# Patient Record
Sex: Female | Born: 1970 | Race: White | Hispanic: Yes | Marital: Married | Smoking: Former smoker
Health system: Southern US, Community
[De-identification: ages and names within clinical notes are randomized; demographics above are authoritative.]

## PROBLEM LIST (undated history)

## (undated) DIAGNOSIS — E669 Obesity, unspecified: Secondary | ICD-10-CM

## (undated) DIAGNOSIS — K219 Gastro-esophageal reflux disease without esophagitis: Secondary | ICD-10-CM

## (undated) DIAGNOSIS — J45909 Unspecified asthma, uncomplicated: Secondary | ICD-10-CM

## (undated) HISTORY — PX: TUBAL LIGATION: SHX77

---

## 2000-05-03 ENCOUNTER — Emergency Department (HOSPITAL_COMMUNITY): Admission: EM | Admit: 2000-05-03 | Discharge: 2000-05-03 | Payer: Self-pay | Admitting: Emergency Medicine

## 2000-05-04 ENCOUNTER — Ambulatory Visit (HOSPITAL_COMMUNITY): Admission: RE | Admit: 2000-05-04 | Discharge: 2000-05-04 | Payer: Self-pay | Admitting: Emergency Medicine

## 2000-05-04 ENCOUNTER — Encounter: Payer: Self-pay | Admitting: Emergency Medicine

## 2000-05-07 ENCOUNTER — Other Ambulatory Visit: Admission: RE | Admit: 2000-05-07 | Discharge: 2000-05-07 | Payer: Self-pay | Admitting: Gynecology

## 2000-06-30 ENCOUNTER — Emergency Department (HOSPITAL_COMMUNITY): Admission: EM | Admit: 2000-06-30 | Discharge: 2000-07-01 | Payer: Self-pay | Admitting: Emergency Medicine

## 2000-07-01 ENCOUNTER — Encounter: Payer: Self-pay | Admitting: Emergency Medicine

## 2003-01-23 ENCOUNTER — Emergency Department (HOSPITAL_COMMUNITY): Admission: AD | Admit: 2003-01-23 | Discharge: 2003-01-23 | Payer: Self-pay | Admitting: Family Medicine

## 2003-05-30 ENCOUNTER — Emergency Department (HOSPITAL_COMMUNITY): Admission: EM | Admit: 2003-05-30 | Discharge: 2003-05-30 | Payer: Self-pay | Admitting: Family Medicine

## 2003-12-28 ENCOUNTER — Ambulatory Visit: Payer: Self-pay | Admitting: Family Medicine

## 2004-01-25 ENCOUNTER — Ambulatory Visit: Payer: Self-pay | Admitting: Family Medicine

## 2004-02-01 ENCOUNTER — Other Ambulatory Visit: Admission: RE | Admit: 2004-02-01 | Discharge: 2004-02-01 | Payer: Self-pay | Admitting: Family Medicine

## 2004-02-01 ENCOUNTER — Ambulatory Visit: Payer: Self-pay | Admitting: Family Medicine

## 2004-03-26 ENCOUNTER — Ambulatory Visit: Payer: Self-pay | Admitting: Family Medicine

## 2004-03-27 ENCOUNTER — Other Ambulatory Visit: Admission: RE | Admit: 2004-03-27 | Discharge: 2004-03-27 | Payer: Self-pay | Admitting: Obstetrics and Gynecology

## 2004-03-27 ENCOUNTER — Ambulatory Visit: Payer: Self-pay | Admitting: Family Medicine

## 2004-04-22 ENCOUNTER — Ambulatory Visit: Payer: Self-pay | Admitting: Obstetrics and Gynecology

## 2004-05-01 ENCOUNTER — Ambulatory Visit: Payer: Self-pay | Admitting: Internal Medicine

## 2004-07-17 ENCOUNTER — Ambulatory Visit: Payer: Self-pay | Admitting: Family Medicine

## 2004-07-18 ENCOUNTER — Ambulatory Visit: Payer: Self-pay | Admitting: Family Medicine

## 2004-07-22 ENCOUNTER — Ambulatory Visit: Payer: Self-pay | Admitting: *Deleted

## 2004-09-18 ENCOUNTER — Encounter (INDEPENDENT_AMBULATORY_CARE_PROVIDER_SITE_OTHER): Payer: Self-pay | Admitting: *Deleted

## 2004-09-18 ENCOUNTER — Ambulatory Visit: Payer: Self-pay | Admitting: *Deleted

## 2004-11-11 ENCOUNTER — Ambulatory Visit: Payer: Self-pay | Admitting: Obstetrics and Gynecology

## 2004-11-17 ENCOUNTER — Ambulatory Visit: Payer: Self-pay | Admitting: Obstetrics and Gynecology

## 2004-11-17 ENCOUNTER — Encounter (INDEPENDENT_AMBULATORY_CARE_PROVIDER_SITE_OTHER): Payer: Self-pay | Admitting: *Deleted

## 2004-11-17 ENCOUNTER — Ambulatory Visit (HOSPITAL_COMMUNITY): Admission: RE | Admit: 2004-11-17 | Discharge: 2004-11-17 | Payer: Self-pay | Admitting: Obstetrics and Gynecology

## 2004-12-04 ENCOUNTER — Ambulatory Visit: Payer: Self-pay | Admitting: Obstetrics and Gynecology

## 2005-01-12 ENCOUNTER — Ambulatory Visit (HOSPITAL_COMMUNITY): Admission: RE | Admit: 2005-01-12 | Discharge: 2005-01-12 | Payer: Self-pay | Admitting: Family Medicine

## 2005-01-12 ENCOUNTER — Emergency Department (HOSPITAL_COMMUNITY): Admission: EM | Admit: 2005-01-12 | Discharge: 2005-01-12 | Payer: Self-pay | Admitting: Family Medicine

## 2005-06-04 ENCOUNTER — Encounter (INDEPENDENT_AMBULATORY_CARE_PROVIDER_SITE_OTHER): Payer: Self-pay | Admitting: *Deleted

## 2005-06-04 ENCOUNTER — Ambulatory Visit: Payer: Self-pay | Admitting: Obstetrics and Gynecology

## 2007-07-07 ENCOUNTER — Ambulatory Visit: Payer: Self-pay | Admitting: Obstetrics & Gynecology

## 2007-07-07 ENCOUNTER — Encounter: Payer: Self-pay | Admitting: Obstetrics & Gynecology

## 2007-07-21 ENCOUNTER — Ambulatory Visit: Payer: Self-pay | Admitting: Obstetrics and Gynecology

## 2007-07-25 ENCOUNTER — Ambulatory Visit (HOSPITAL_COMMUNITY): Admission: RE | Admit: 2007-07-25 | Discharge: 2007-07-25 | Payer: Self-pay | Admitting: Family Medicine

## 2008-03-13 ENCOUNTER — Ambulatory Visit: Payer: Self-pay | Admitting: Family Medicine

## 2008-07-04 ENCOUNTER — Ambulatory Visit: Payer: Self-pay | Admitting: Family Medicine

## 2008-10-10 ENCOUNTER — Encounter (INDEPENDENT_AMBULATORY_CARE_PROVIDER_SITE_OTHER): Payer: Self-pay | Admitting: Adult Health

## 2008-10-10 ENCOUNTER — Ambulatory Visit: Payer: Self-pay | Admitting: Internal Medicine

## 2008-10-10 LAB — CONVERTED CEMR LAB
AST: 16 units/L (ref 0–37)
Albumin: 4.2 g/dL (ref 3.5–5.2)
Alkaline Phosphatase: 74 units/L (ref 39–117)
Anti Nuclear Antibody(ANA): NEGATIVE
BUN: 12 mg/dL (ref 6–23)
Basophils Absolute: 0 10*3/uL (ref 0.0–0.1)
Basophils Relative: 0 % (ref 0–1)
Creatinine, Ser: 0.61 mg/dL (ref 0.40–1.20)
Eosinophils Absolute: 0.4 10*3/uL (ref 0.0–0.7)
Eosinophils Relative: 4 % (ref 0–5)
Glucose, Bld: 77 mg/dL (ref 70–99)
HCT: 40.4 % (ref 36.0–46.0)
HDL: 47 mg/dL (ref >39)
LDL Cholesterol: 79 mg/dL (ref 0–99)
MCV: 89.4 fL (ref 78.0–100.0)
Neutrophils Relative %: 46 % (ref 43–77)
Platelets: 366 10*3/uL (ref 150–400)
RDW: 13.7 % (ref 11.5–15.5)
Total Bilirubin: 0.4 mg/dL (ref 0.3–1.2)
Total CHOL/HDL Ratio: 3
Triglycerides: 78 mg/dL (ref <150)
VLDL: 16 mg/dL (ref 0–40)
Vit D, 25-Hydroxy: 9 ng/mL — ABNORMAL LOW (ref 30–89)
WBC: 9.3 10*3/uL (ref 4.0–10.5)

## 2008-10-11 ENCOUNTER — Ambulatory Visit (HOSPITAL_COMMUNITY): Admission: RE | Admit: 2008-10-11 | Discharge: 2008-10-11 | Payer: Self-pay | Admitting: Internal Medicine

## 2008-10-25 ENCOUNTER — Ambulatory Visit: Payer: Self-pay | Admitting: Internal Medicine

## 2008-11-27 ENCOUNTER — Encounter (INDEPENDENT_AMBULATORY_CARE_PROVIDER_SITE_OTHER): Payer: Self-pay | Admitting: Adult Health

## 2008-11-27 ENCOUNTER — Ambulatory Visit: Payer: Self-pay | Admitting: Internal Medicine

## 2008-12-27 ENCOUNTER — Ambulatory Visit: Payer: Self-pay | Admitting: Internal Medicine

## 2009-01-28 ENCOUNTER — Ambulatory Visit: Payer: Self-pay | Admitting: Internal Medicine

## 2009-09-26 ENCOUNTER — Ambulatory Visit: Payer: Self-pay | Admitting: Family Medicine

## 2009-09-26 ENCOUNTER — Ambulatory Visit (HOSPITAL_COMMUNITY): Admission: RE | Admit: 2009-09-26 | Discharge: 2009-09-26 | Payer: Self-pay | Admitting: Family Medicine

## 2009-11-26 ENCOUNTER — Ambulatory Visit: Payer: Self-pay | Admitting: Internal Medicine

## 2009-11-28 ENCOUNTER — Encounter
Admission: RE | Admit: 2009-11-28 | Discharge: 2010-01-14 | Payer: Self-pay | Source: Home / Self Care | Attending: Family Medicine | Admitting: Family Medicine

## 2009-12-24 ENCOUNTER — Encounter (INDEPENDENT_AMBULATORY_CARE_PROVIDER_SITE_OTHER): Payer: Self-pay | Admitting: Family Medicine

## 2009-12-24 LAB — CONVERTED CEMR LAB
Albumin: 3.7 g/dL (ref 3.5–5.2)
Alkaline Phosphatase: 68 units/L (ref 39–117)
Calcium: 8.7 mg/dL (ref 8.4–10.5)
Chloride: 104 meq/L (ref 96–112)
Glucose, Bld: 95 mg/dL (ref 70–99)
Hgb A1c MFr Bld: 5.7 % — ABNORMAL HIGH (ref <5.7)
LDL Cholesterol: 80 mg/dL (ref 0–99)
Potassium: 4 meq/L (ref 3.5–5.3)
Sodium: 138 meq/L (ref 135–145)
TSH: 1.061 microintl units/mL (ref 0.350–4.500)
Total Protein: 6.7 g/dL (ref 6.0–8.3)
Triglycerides: 93 mg/dL (ref <150)

## 2010-01-17 ENCOUNTER — Emergency Department (HOSPITAL_COMMUNITY)
Admission: EM | Admit: 2010-01-17 | Discharge: 2010-01-17 | Payer: Self-pay | Source: Home / Self Care | Admitting: Family Medicine

## 2010-04-07 LAB — POCT INFECTIOUS MONO SCREEN: Mono Screen: NEGATIVE

## 2010-04-07 LAB — POCT RAPID STREP A (OFFICE): Streptococcus, Group A Screen (Direct): NEGATIVE

## 2010-06-10 NOTE — Group Therapy Note (Signed)
NAME:  Wendy Bradley, WILE.:  [id number]   MEDICAL RECORD NO.:  [id number]          PATIENT TYPE:  WOC   LOCATION:  [facility]                   FACILITY:  [facility]   PHYSICIAN:  Argentina Donovan, MD        DATE OF BIRTH:  [birth year]   DATE OF SERVICE:  07/21/2007                                  CLINIC NOTE   The patient is a 40 year old Hispanic married female, gravida 4, para 4-  0-0-4, who was in a short time ago because of amenorrhea.  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  She came back  today for her results, with the story that she had been 18 months  without a period until last week.  The patient weighs 292 pounds and I  have told her that there is possibility she could have an endometrial  hyperplasia; and we discussed that. Sending her for an ultrasound; if  her endometrial stripe is greater than normal, I think we have to have  her come back with endometrial biopsy.  We will call her and let her  know that.   IMPRESSION:  Secondary amenorrhea, probable polycystic ovarian syndrome.           ______________________________  Argentina Donovan, MD     PR/MEDQ  D:  07/21/2007  T:  07/21/2007  Job:  [id number]

## 2010-06-10 NOTE — Group Therapy Note (Signed)
NAME:  ARDINE, IACOVELLI NO.:  [id number]   MEDICAL RECORD NO.:  [id number]          PATIENT TYPE:  WOC   LOCATION:  [facility]                   FACILITY:  [facility]   PHYSICIAN:  Allie Bossier, MD        DATE OF BIRTH:  [birth year]   DATE OF SERVICE:  07/07/2007                                  CLINIC NOTE   Wendy Bradley is a 40 year old married Hispanic gravida 4, para 4 who comes in  for her annual exam.  She mentioned that her period began Jun 26, 2007.  Prior to that, she had not had a period since December of 2007.   PAST MEDICAL HISTORY:  Morbid obesity. History of CIN-3 status post [facility]  in 2006.  She has been a previous smoker.  She did quit in February of  this year.   PAST SURGICAL HISTORY:  [facility] October 2006, CIN-3, negative margins, and  tubal ligation.   SOCIAL HISTORY:  She quit smoking and drinking this year.  No known drug  allergies.  No latex allergies.   FAMILY HISTORY:  Is significant for cervical cancer in her paternal  grandmother who is deceased.  She denies a family history of breast or  colon malignancies.  She does report a family history of diabetes and  myocardial infarction.   MEDICATIONS:  None.   PHYSICAL EXAMINATION:  Weight 292 pounds, blood pressure 134/90, pulse  104.  She is afebrile.  HEENT:  Normal.  She does have moderate amount of hirsutism facially.  HEART:  Regular rate and rhythm.  BREAST EXAM:  Normal bilaterally.  ABDOMEN:  Obese, benign.  EXTERNAL GENITALIA:  She has acanthosis nigricans of her inner thigh  skin.  External genitalia is without lesions.  Cervix is parous.  Uterus  and adnexa are not appreciably enlarged.  Please note that the exam is  impaired by her central obesity.   ASSESSMENT/PLAN:  1. Annual exam.  I have recommend continued weight loss.  Recommend      self-breast exam, self-vulvar exam monthly.  I checked a Pap smear.      With regard to her vaginal bleeding that had not occurred for over      a  year, I checked a TSH, FSH.  2. Acanthosis nigricans.  Checked a CBG today.      Allie Bossier, MD    MCD/MEDQ  D:  07/07/2007  T:  07/07/2007  Job:  [id number]

## 2010-06-13 NOTE — Group Therapy Note (Signed)
NAME:  Wendy, Bradley NO.:  [id number]   MEDICAL RECORD NO.:  [id number]          PATIENT TYPE:  WOC   LOCATION:  [facility]                   FACILITY:  [facility]   PHYSICIAN:  Carolanne Grumbling, M.D.   DATE OF BIRTH:  Sep 07, 1970   DATE OF SERVICE:  09/18/2004                                    CLINIC NOTE   HISTORY OF PRESENT ILLNESS:  The patient is a 40 year old female here for  follow-up Pap smear. 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 The patient denies any problems since that time. She has  irregular periods. Her last one was August 12 and then she did have some  breakthrough bleeding a couple of days ago. She is not currently bleeding.   PHYSICAL EXAMINATION:  VITAL SIGNS:  Stable, weight 205, height 5 feet 3  inches.  GENERAL:  Well-developed, well-nourished female in no apparent distress.  GENITOURINARY:  Normal external genitalia. Cervix is parous. Pap smear done.   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   PLAN:  The patient will be notified of the Pap smear results and will be  told when to follow up after that.           ______________________________  Carolanne Grumbling, M.D.     TW/MEDQ  D:  09/18/2004  T:  09/19/2004  Job:  [id number]

## 2010-06-13 NOTE — Op Note (Signed)
NAME:  Wendy Bradley, HLAVATY              ACCOUNT NO.:  [id number]   MEDICAL RECORD NO.:  [id number]          PATIENT TYPE:  AMB   LOCATION:  [facility]                           [facility]   PHYSICIAN:  Phil D. Okey Dupre, M.D.     DATE OF BIRTH:  04-21-1970   DATE OF PROCEDURE:  11/17/2004  DATE OF DISCHARGE:                                 OPERATIVE REPORT   PROCEDURES:  Cold knife cauterization of the cervix.   PREOPERATIVE DIAGNOSIS:  Severe cervical dysplasia following cryotherapy  failure.   POSTOPERATIVE DIAGNOSIS:  Severe cervical dysplasia following cryotherapy  failure.   SURGEON:  Javier Glazier. Okey Dupre, M.D.   FIRST ASSISTANT:  Tracy L. Mayford Knife, M.D.   ANESTHESIA:  Started on MAC and switched to general.   ESTIMATED BLOOD LOSS:  30 cc   SPECIMENS TO PATHOLOGY:  Cervical conization.   POSTOPERATIVE CONDITION:  Satisfactory.   OPERATIVE FINDINGS:  On examining the vagina, the cervix was easily  visualized and was found to be significantly fish mouthed with markedly  redundant tissue both with a bladder flap as well as the laterally around  the entire circumference of the cervix.  A weighted speculum was placed in  the posterior fourchette of the vagina.  The anterior lip of the cervix was  grasped with a single-tooth tenaculum, and Deavers were used for exposure.  The lateral sutures of 1-0 chromic were placed in each of the lateral  cervical angles and tied taut.  These  were done in figure-of-eights; 10 cc  of 1% Xylocaine was injected in each of the paracervical areas for  postoperative comfort and a circular incision was made around the entire  circumference of the cervix with an 11 blade approximately 0.5 cm from the  external cervical os which was extended laterally beyond the area of the  cervical separation fishmouth.  Once this was done, those edges were grasped  with a ring forceps and the conization was completed for a total length up  into the cervix of approximately 1.5 cm.   A circumferential pursestring was  run external to the cervix and as high up as possible just below the bladder  reflection, staying very superficial around the entire circumference of  cervix.  This was tied down taut using one chromic catgut.  There was  minimal bleeding during the procedure.  Postoperatively, the entire area was  stained with Lugol's solution and there were no nonstaining areas seen  within the area of the cervix. The patient was transferred recovery room in  the minimal blood loss of 30 mL.  She  tolerated the procedure well. Tape,  instrument, sponge and needle counts were reported correct at the end of the  procedure.          ______________________________  Javier Glazier. Okey Dupre, M.D.    PDR/MEDQ  D:  11/17/2004  T:  11/17/2004  Job:  [id number]

## 2011-08-20 IMAGING — CR DG KNEE COMPLETE 4+V*L*
4 series · 4 of 4 positions shown · non-contrast
Comparison: None

CLINICAL DATA: Left knee pain.

LEFT KNEE - COMPLETE 4+ VIEW

[t knee ap left]
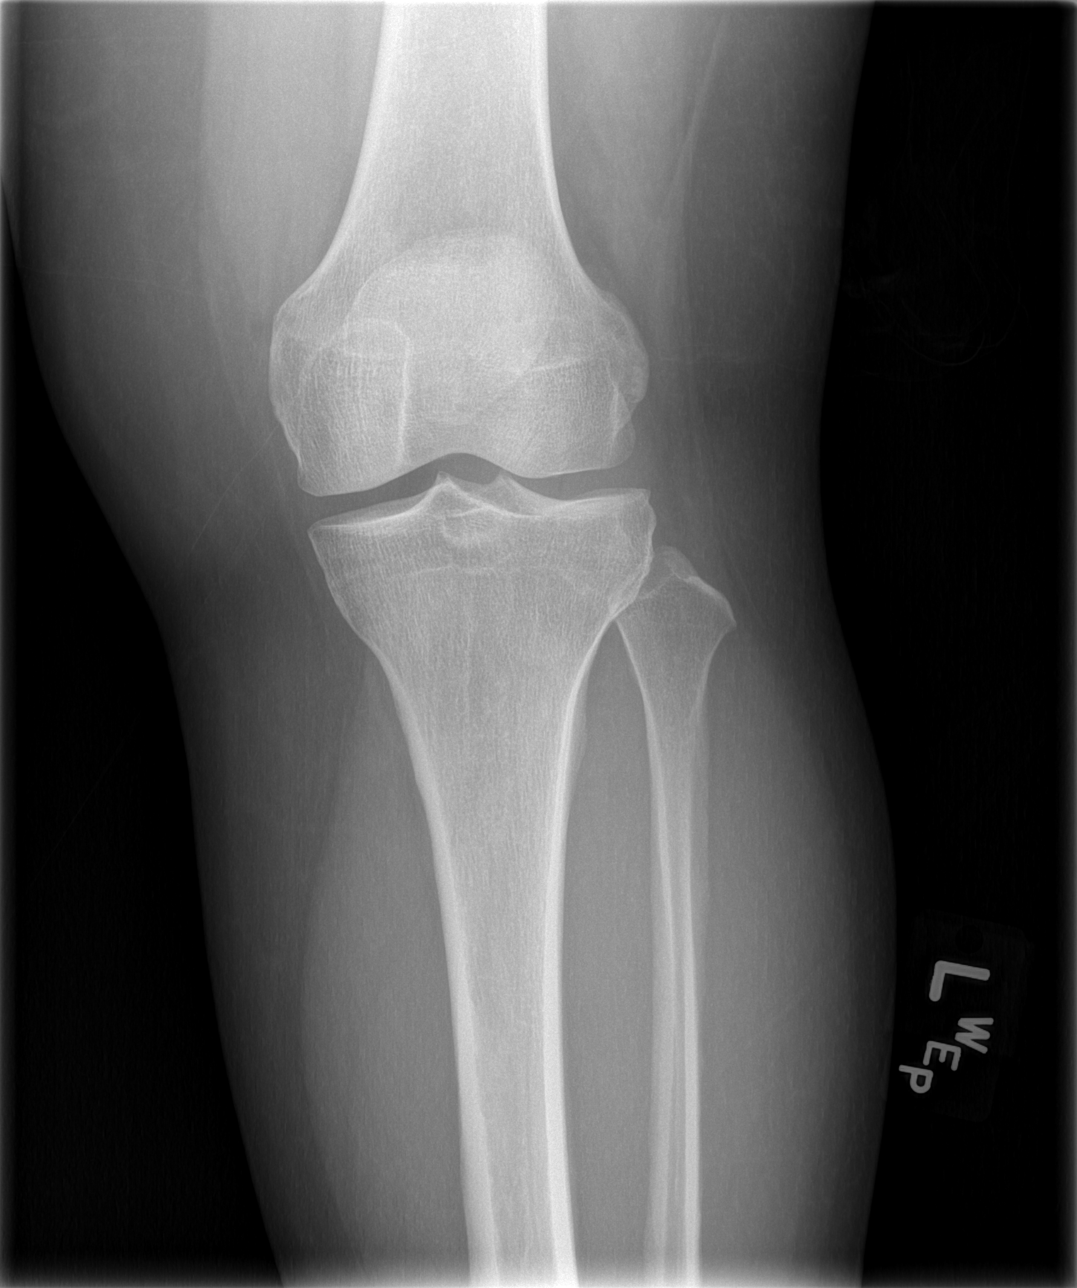

[t knee oblique left (1 of 2)]
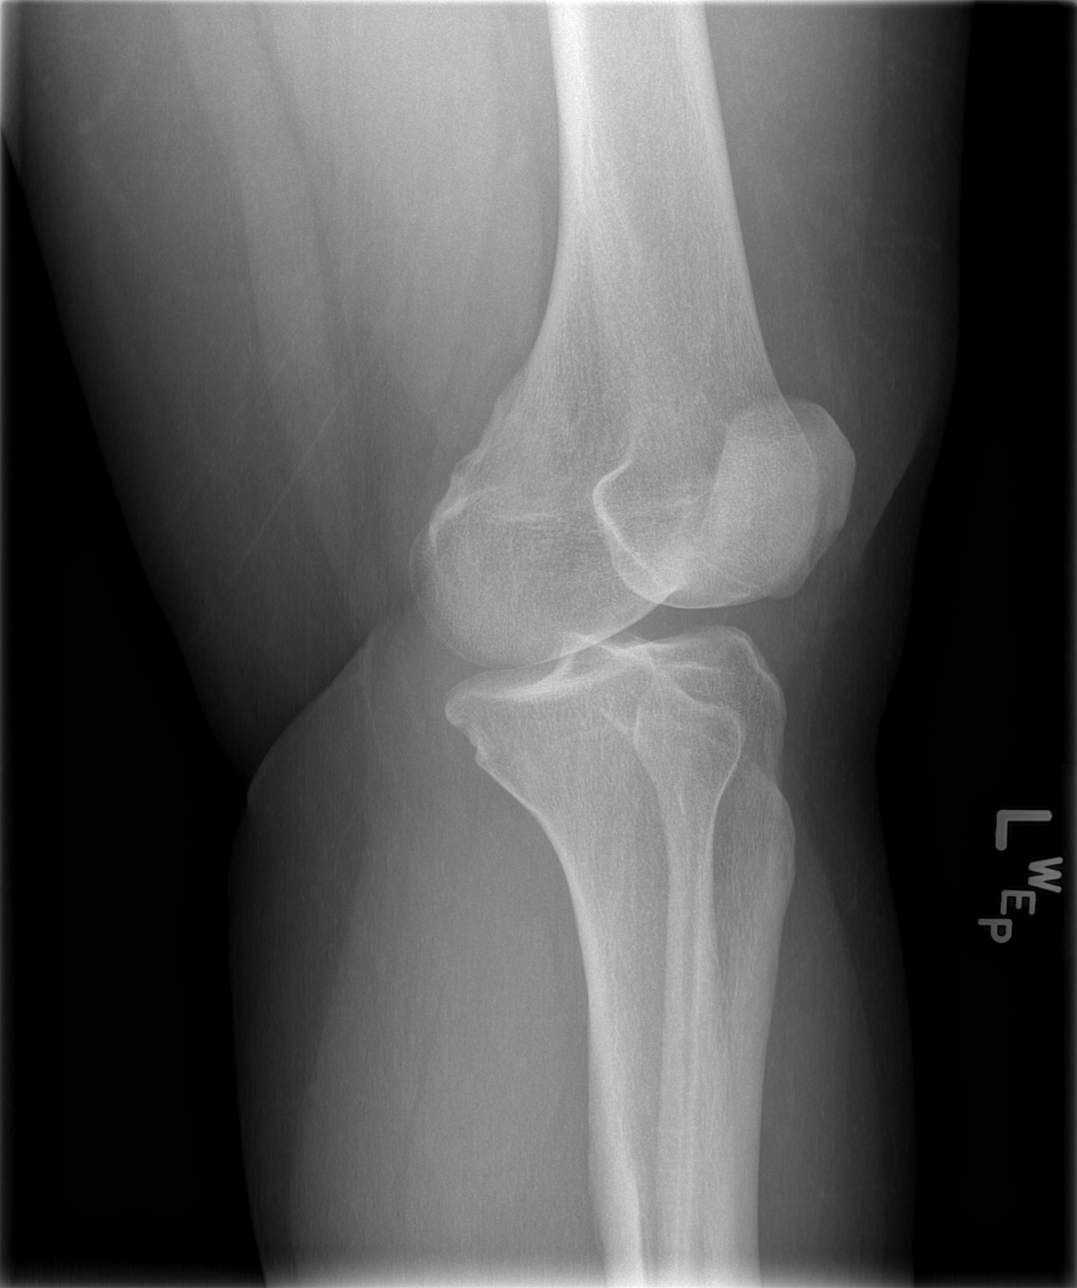

[t knee oblique left (2 of 2)]
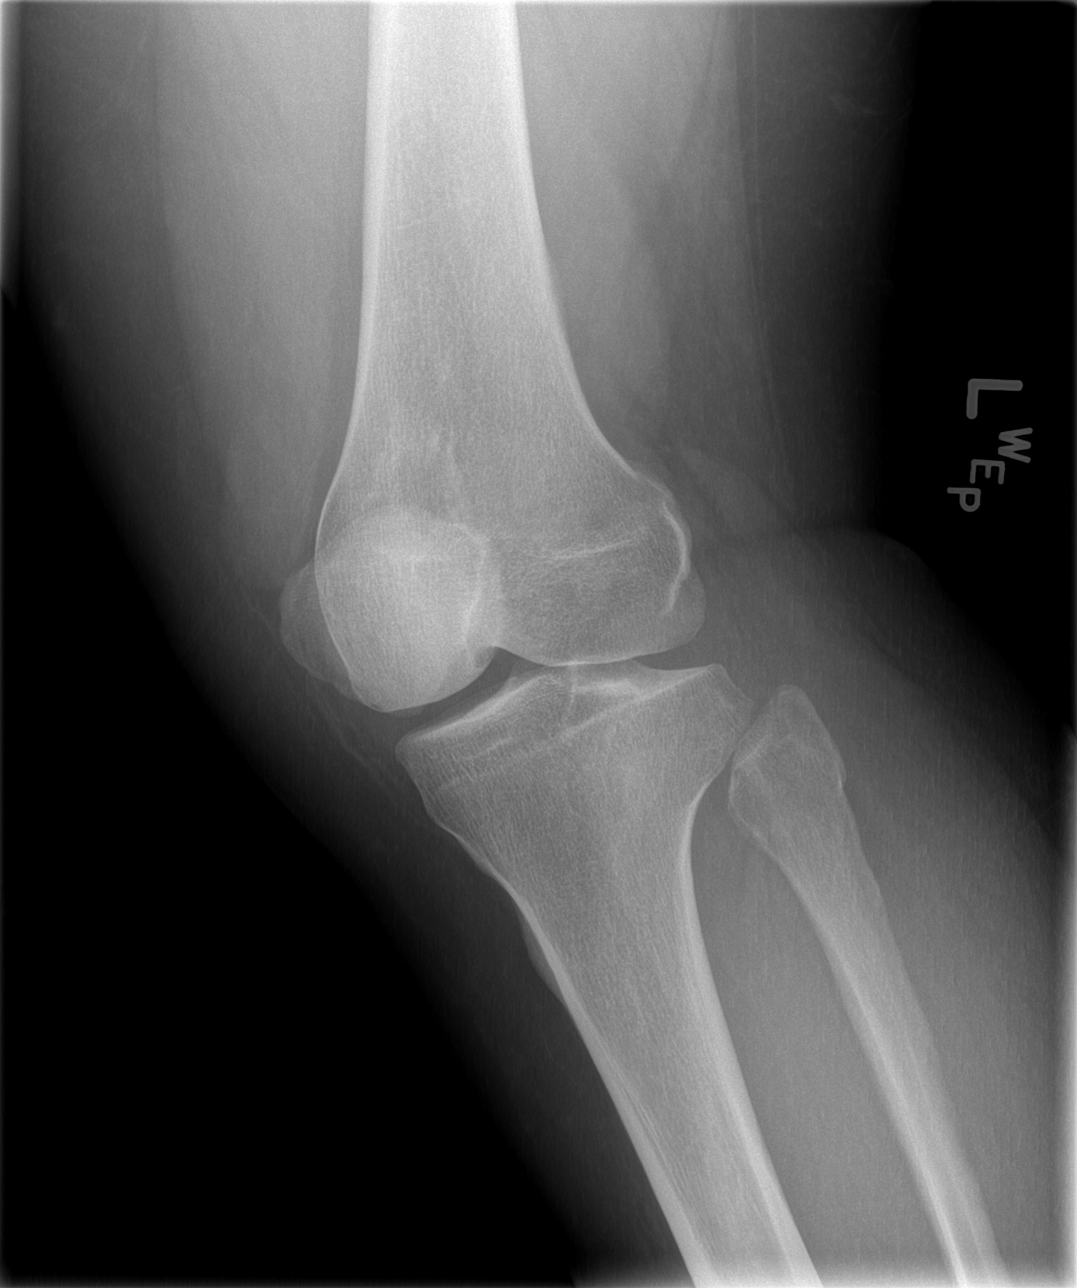

[t knee lat left]
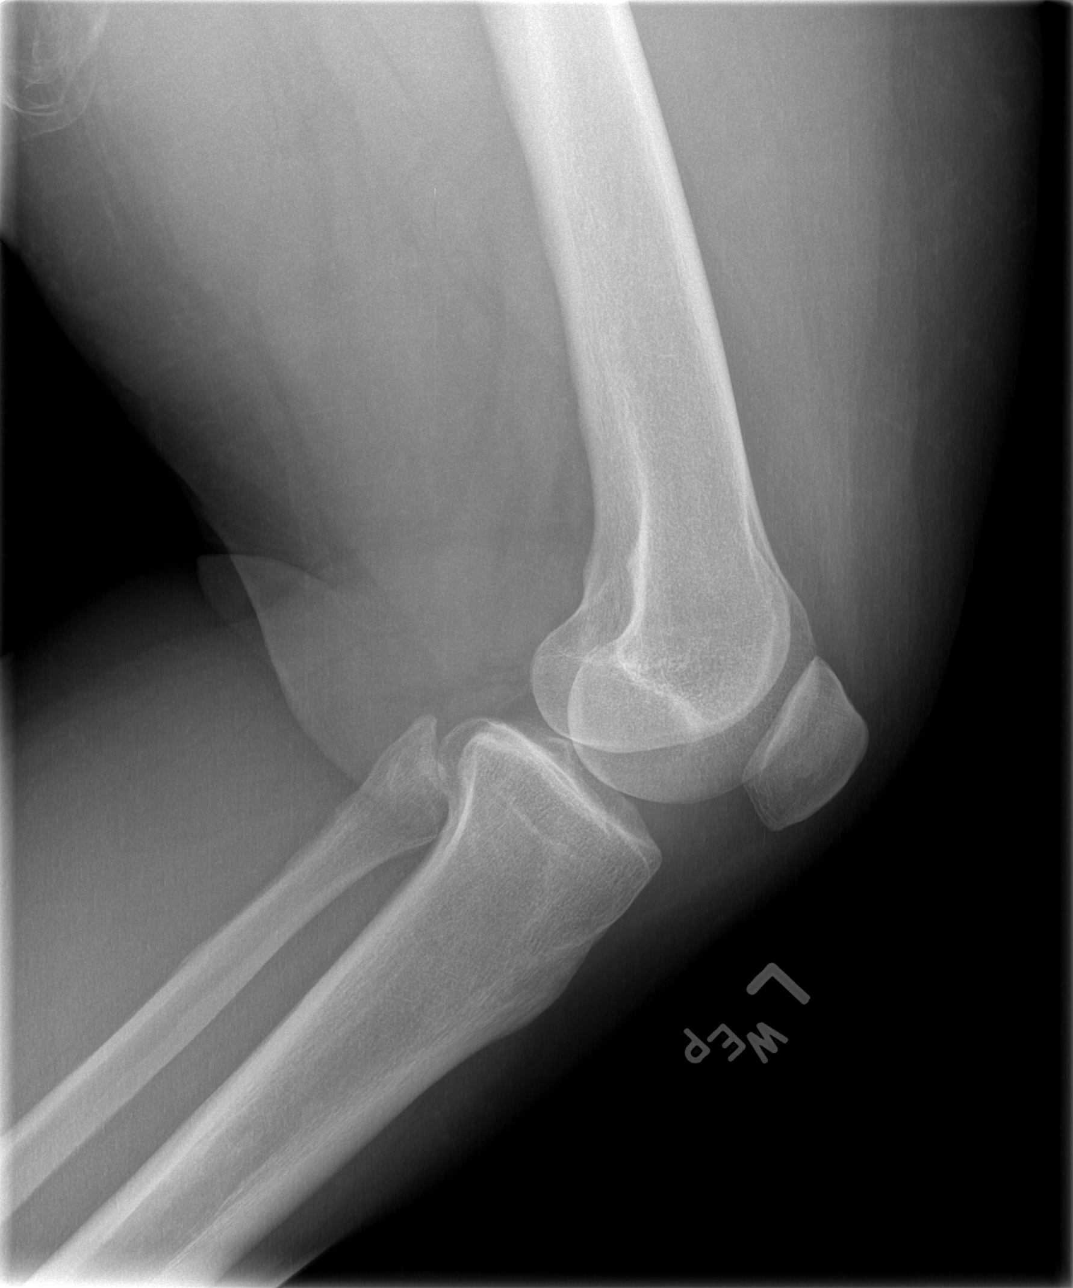

[4 of 4 positions shown; findings below may reference images not displayed]

FINDINGS: The joint spaces are maintained.  No fractures are seen.
No definite joint effusion.  No osteochondral abnormality.
IMPRESSION: No acute bony findings.

## 2013-06-20 ENCOUNTER — Emergency Department (HOSPITAL_COMMUNITY): Payer: Self-pay

## 2013-06-20 ENCOUNTER — Encounter (HOSPITAL_COMMUNITY): Payer: Self-pay | Admitting: Emergency Medicine

## 2013-06-20 ENCOUNTER — Emergency Department (HOSPITAL_COMMUNITY)
Admission: EM | Admit: 2013-06-20 | Discharge: 2013-06-20 | Disposition: A | Discharge status: Home / Self Care | Payer: Self-pay | Attending: Emergency Medicine | Admitting: Emergency Medicine

## 2013-06-20 DIAGNOSIS — IMO0002 Reserved for concepts with insufficient information to code with codable children: Secondary | ICD-10-CM | POA: Insufficient documentation

## 2013-06-20 DIAGNOSIS — Y9389 Activity, other specified: Secondary | ICD-10-CM | POA: Insufficient documentation

## 2013-06-20 DIAGNOSIS — Y92410 Unspecified street and highway as the place of occurrence of the external cause: Secondary | ICD-10-CM | POA: Insufficient documentation

## 2013-06-20 DIAGNOSIS — R209 Unspecified disturbances of skin sensation: Secondary | ICD-10-CM | POA: Insufficient documentation

## 2013-06-20 DIAGNOSIS — F17200 Nicotine dependence, unspecified, uncomplicated: Secondary | ICD-10-CM | POA: Insufficient documentation

## 2013-06-20 DIAGNOSIS — S139XXA Sprain of joints and ligaments of unspecified parts of neck, initial encounter: Secondary | ICD-10-CM | POA: Insufficient documentation

## 2013-06-20 DIAGNOSIS — S161XXA Strain of muscle, fascia and tendon at neck level, initial encounter: Secondary | ICD-10-CM

## 2013-06-20 MED ORDER — NAPROXEN 500 MG PO TABS: 500.0000 mg | ORAL_TABLET | Freq: Two times a day (BID) | ORAL | Status: DC

## 2013-06-20 MED ORDER — PREDNISONE 10 MG PO TABS: 40.0000 mg | ORAL_TABLET | Freq: Every day | ORAL | Status: DC

## 2013-06-20 MED ORDER — CYCLOBENZAPRINE HCL 10 MG PO TABS: 10.0000 mg | ORAL_TABLET | Freq: Two times a day (BID) | ORAL | Status: DC | PRN

## 2013-06-20 MED ORDER — OXYCODONE-ACETAMINOPHEN 5-325 MG PO TABS
1.0000 | ORAL_TABLET | Freq: Once | ORAL | Status: AC
  Administered 2013-06-20: 1 via ORAL
  Filled 2013-06-20: qty 1

## 2013-06-20 MED ORDER — HYDROCODONE-ACETAMINOPHEN 5-325 MG PO TABS: 1.0000 | ORAL_TABLET | Freq: Four times a day (QID) | ORAL | Status: DC | PRN

## 2013-06-20 NOTE — Discharge Instructions (Signed)
CT is normal. Naprosyn for pain. Prednisone until all gone. Flexeril for spasms. Norco for severe pain. Follow up with primary care doctor. Return if worsening numness and weakness in extremities.    Cervical Sprain A cervical sprain is an injury in the neck in which the strong, fibrous tissues (ligaments) that connect your neck bones stretch or tear. Cervical sprains can range from mild to severe. Severe cervical sprains can cause the neck vertebrae to be unstable. This can lead to damage of the spinal cord and can result in serious nervous system problems. The amount of time it takes for a cervical sprain to get better depends on the cause and extent of the injury. Most cervical sprains heal in 1 to 3 weeks. CAUSES  Severe cervical sprains may be caused by:   Contact sport injuries (such as from football, rugby, wrestling, hockey, auto racing, gymnastics, diving, martial arts, or boxing).   Motor vehicle collisions.   Whiplash injuries. This is an injury from a sudden forward-and backward whipping movement of the head and neck.  Falls.  Mild cervical sprains may be caused by:   Being in an awkward position, such as while cradling a telephone between your ear and shoulder.   Sitting in a chair that does not offer proper support.   Working at a poorly Marketing executive station.   Looking up or down for long periods of time.  SYMPTOMS   Pain, soreness, stiffness, or a burning sensation in the front, back, or sides of the neck. This discomfort may develop immediately after the injury or slowly, 24 hours or more after the injury.   Pain or tenderness directly in the middle of the back of the neck.   Shoulder or upper back pain.   Limited ability to move the neck.   Headache.   Dizziness.   Weakness, numbness, or tingling in the hands or arms.   Muscle spasms.   Difficulty swallowing or chewing.   Tenderness and swelling of the neck.  DIAGNOSIS  Most of the  time your health care provider can diagnose a cervical sprain by taking your history and doing a physical exam. Your health care provider will ask about previous neck injuries and any known neck problems, such as arthritis in the neck. X-rays may be taken to find out if there are any other problems, such as with the bones of the neck. Other tests, such as a CT scan or MRI, may also be needed.  TREATMENT  Treatment depends on the severity of the cervical sprain. Mild sprains can be treated with rest, keeping the neck in place (immobilization), and pain medicines. Severe cervical sprains are immediately immobilized. Further treatment is done to help with pain, muscle spasms, and other symptoms and may include:  Medicines, such as pain relievers, numbing medicines, or muscle relaxants.   Physical therapy. This may involve stretching exercises, strengthening exercises, and posture training. Exercises and improved posture can help stabilize the neck, strengthen muscles, and help stop symptoms from returning.  HOME CARE INSTRUCTIONS   Put ice on the injured area.   Put ice in a plastic bag.   Place a towel between your skin and the bag.   Leave the ice on for 15 20 minutes, 3 4 times a day.   If your injury was severe, you may have been given a cervical collar to wear. A cervical collar is a two-piece collar designed to keep your neck from moving while it heals.  Do not remove  the collar unless instructed by your health care provider.  If you have long hair, keep it outside of the collar.  Ask your health care provider before making any adjustments to your collar. Minor adjustments may be required over time to improve comfort and reduce pressure on your chin or on the back of your head.  Ifyou are allowed to remove the collar for cleaning or bathing, follow your health care provider's instructions on how to do so safely.  Keep your collar clean by wiping it with mild soap and water and  drying it completely. If the collar you have been given includes removable pads, remove them every 1 2 days and hand wash them with soap and water. Allow them to air dry. They should be completely dry before you wear them in the collar.  If you are allowed to remove the collar for cleaning and bathing, wash and dry the skin of your neck. Check your skin for irritation or sores. If you see any, tell your health care provider.  Do not drive while wearing the collar.   Only take over-the-counter or prescription medicines for pain, discomfort, or fever as directed by your health care provider.   Keep all follow-up appointments as directed by your health care provider.   Keep all physical therapy appointments as directed by your health care provider.   Make any needed adjustments to your workstation to promote good posture.   Avoid positions and activities that make your symptoms worse.   Warm up and stretch before being active to help prevent problems.  SEEK MEDICAL CARE IF:   Your pain is not controlled with medicine.   You are unable to decrease your pain medicine over time as planned.   Your activity level is not improving as expected.  SEEK IMMEDIATE MEDICAL CARE IF:   You develop any bleeding.  You develop stomach upset.  You have signs of an allergic reaction to your medicine.   Your symptoms get worse.   You develop new, unexplained symptoms.   You have numbness, tingling, weakness, or paralysis in any part of your body.  MAKE SURE YOU:   Understand these instructions.  Will watch your condition.  Will get help right away if you are not doing well or get worse. Document Released: 11/09/2006 Document Revised: 11/02/2012 Document Reviewed: 07/20/2012 [facility] Patient Information 2014 [place].
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
## 2013-06-22 NOTE — ED Provider Notes (Signed)
Medical screening examination/treatment/procedure(s) were performed by non-physician practitioner and as supervising physician I was immediately available for consultation/collaboration.   EKG Interpretation None        Gilda Crease, MD 06/22/13 [phone]

## 2013-08-20 ENCOUNTER — Encounter (HOSPITAL_COMMUNITY): Payer: Self-pay | Admitting: Emergency Medicine

## 2013-08-20 ENCOUNTER — Emergency Department (HOSPITAL_COMMUNITY): Payer: No Typology Code available for payment source

## 2013-08-20 ENCOUNTER — Emergency Department (HOSPITAL_COMMUNITY)
Admission: EM | Admit: 2013-08-20 | Discharge: 2013-08-20 | Disposition: A | Discharge status: Home / Self Care | Payer: Self-pay | Attending: Emergency Medicine | Admitting: Emergency Medicine

## 2013-08-20 DIAGNOSIS — Z79899 Other long term (current) drug therapy: Secondary | ICD-10-CM | POA: Insufficient documentation

## 2013-08-20 DIAGNOSIS — R0602 Shortness of breath: Secondary | ICD-10-CM | POA: Insufficient documentation

## 2013-08-20 DIAGNOSIS — J209 Acute bronchitis, unspecified: Secondary | ICD-10-CM

## 2013-08-20 DIAGNOSIS — K219 Gastro-esophageal reflux disease without esophagitis: Secondary | ICD-10-CM | POA: Insufficient documentation

## 2013-08-20 DIAGNOSIS — F17200 Nicotine dependence, unspecified, uncomplicated: Secondary | ICD-10-CM | POA: Insufficient documentation

## 2013-08-20 DIAGNOSIS — J45901 Unspecified asthma with (acute) exacerbation: Secondary | ICD-10-CM | POA: Insufficient documentation

## 2013-08-20 HISTORY — DX: Gastro-esophageal reflux disease without esophagitis: K21.9

## 2013-08-20 HISTORY — DX: Unspecified asthma, uncomplicated: J45.909

## 2013-08-20 LAB — CBC
HEMATOCRIT: 41.1 % (ref 36.0–46.0)
HEMOGLOBIN: 13.6 g/dL (ref 12.0–15.0)
MCH: 28.8 pg (ref 26.0–34.0)
MCHC: 33.1 g/dL (ref 30.0–36.0)
MCV: 86.9 fL (ref 78.0–100.0)
Platelets: 385 10*3/uL (ref 150–400)
RBC: 4.73 MIL/uL (ref 3.87–5.11)
RDW: 14.1 % (ref 11.5–15.5)
WBC: 11.9 10*3/uL — ABNORMAL HIGH (ref 4.0–10.5)

## 2013-08-20 LAB — BASIC METABOLIC PANEL
Anion gap: 15 (ref 5–15)
BUN: 9 mg/dL (ref 6–23)
CHLORIDE: 102 meq/L (ref 96–112)
CO2: 24 meq/L (ref 19–32)
Calcium: 9.1 mg/dL (ref 8.4–10.5)
Creatinine, Ser: 0.72 mg/dL (ref 0.50–1.10)
GFR calc Af Amer: >90 mL/min (ref >90)
GLUCOSE: 118 mg/dL — AB (ref 70–99)
POTASSIUM: 3.8 meq/L (ref 3.7–5.3)
SODIUM: 141 meq/L (ref 137–147)

## 2013-08-20 LAB — I-STAT TROPONIN, ED: TROPONIN I, POC: 0 ng/mL (ref 0.00–0.08)

## 2013-08-20 MED ORDER — IPRATROPIUM-ALBUTEROL 0.5-2.5 (3) MG/3ML IN SOLN
3.0000 mL | Freq: Once | RESPIRATORY_TRACT | Status: AC
  Administered 2013-08-20: 3 mL via RESPIRATORY_TRACT
  Filled 2013-08-20: qty 3

## 2013-08-20 MED ORDER — PREDNISONE 10 MG PO TABS: 20.0000 mg | ORAL_TABLET | Freq: Two times a day (BID) | ORAL | Status: DC

## 2013-08-20 MED ORDER — AZITHROMYCIN 250 MG PO TABS: ORAL_TABLET | ORAL | Status: DC

## 2013-08-20 MED ORDER — ALBUTEROL SULFATE (2.5 MG/3ML) 0.083% IN NEBU
INHALATION_SOLUTION | RESPIRATORY_TRACT | Status: AC
  Filled 2013-08-20: qty 6

## 2013-08-20 MED ORDER — ALBUTEROL SULFATE (2.5 MG/3ML) 0.083% IN NEBU
5.0000 mg | INHALATION_SOLUTION | Freq: Once | RESPIRATORY_TRACT | Status: AC
  Administered 2013-08-20: 5 mg via RESPIRATORY_TRACT
  Filled 2013-08-20: qty 6

## 2013-08-20 MED ORDER — ALBUTEROL SULFATE HFA 108 (90 BASE) MCG/ACT IN AERS: 2.0000 | INHALATION_SPRAY | RESPIRATORY_TRACT | Status: DC | PRN

## 2013-08-20 MED ORDER — ALBUTEROL SULFATE (2.5 MG/3ML) 0.083% IN NEBU: 5.0000 mg | INHALATION_SOLUTION | Freq: Once | RESPIRATORY_TRACT | Status: DC

## 2013-08-20 MED ORDER — PREDNISONE 20 MG PO TABS
40.0000 mg | ORAL_TABLET | Freq: Once | ORAL | Status: AC
  Administered 2013-08-20: 40 mg via ORAL
  Filled 2013-08-20: qty 2

## 2013-08-20 MED ORDER — ALBUTEROL SULFATE (2.5 MG/3ML) 0.083% IN NEBU
2.5000 mg | INHALATION_SOLUTION | Freq: Once | RESPIRATORY_TRACT | Status: AC
  Administered 2013-08-20: 2.5 mg via RESPIRATORY_TRACT

## 2013-08-20 NOTE — ED Provider Notes (Signed)
CSN: [id number]     Arrival date & time 08/20/13  1920 History   First MD Initiated Contact with Patient 08/20/13 2118     Chief Complaint  Patient presents with  . Shortness of Breath     (Consider location/radiation/quality/duration/timing/severity/associated sxs/prior Treatment) HPI Comments: Patient is a 43 year old female with history of morbid obesity in childhood asthma. She presents with a two-week history of shortness of breath, chest congestion and nonproductive cough. She was a smoker up until one week ago when she quit due to this illness. He denies any fevers or chills. She denies any chest pain.  Patient is a 43 y.o. female presenting with shortness of breath. The history is provided by the patient.  Shortness of Breath Severity:  Moderate Onset quality:  Gradual Duration:  2 weeks Timing:  Constant Progression:  Worsening Chronicity:  New Context: not activity   Relieved by:  Nothing Worsened by:  Nothing tried Ineffective treatments:  None tried Associated symptoms: no chest pain and no fever     Past Medical History  Diagnosis Date  . Asthma   . GERD (gastroesophageal reflux disease)    History reviewed. No pertinent past surgical history. History reviewed. No pertinent family history. History  Substance Use Topics  . Smoking status: Current Every Day Smoker    Types: Cigarettes  . Smokeless tobacco: Never Used  . Alcohol Use: No   OB History   Grav Para Term Preterm Abortions TAB SAB Ect Mult Living                 Review of Systems  Constitutional: Negative for fever.  Respiratory: Positive for shortness of breath.   Cardiovascular: Negative for chest pain.  All other systems reviewed and are negative.     Allergies  Review of patient's allergies indicates no known allergies.  Home Medications   Prior to Admission medications   Medication Sig Start Date End Date Taking? Authorizing Provider  Chlorphen-Pseudoeph-Methscop (ALLERGY AM/PM  PO) Take 1 tablet by mouth 2 (two) times daily.   Yes Historical Provider, MD  dextromethorphan-guaiFENesin (MUCINEX DM) 30-600 MG per 12 hr tablet Take 1 tablet by mouth 2 (two) times daily as needed for cough.   Yes Historical Provider, MD  Dextromethorphan-Guaifenesin 5-100 MG/5ML LIQD Take 20 mLs by mouth daily as needed (for congestion).   Yes Historical Provider, MD  omeprazole (PRILOSEC) 20 MG capsule Take 20 mg by mouth at bedtime.    Yes Historical Provider, MD   BP 137/83  Pulse 105  Temp(Src) 98 F (36.7 C) (Oral)  Resp 20  Wt 335 lb (151.955 kg)  SpO2 97% Physical Exam  Nursing note and vitals reviewed. Constitutional: She is oriented to person, place, and time. She appears well-developed and well-nourished. No distress.  HENT:  Head: Normocephalic and atraumatic.  Neck: Normal range of motion. Neck supple.  Cardiovascular: Normal rate and regular rhythm.  Exam reveals no gallop and no friction rub.   No murmur heard. Pulmonary/Chest: Effort normal. No respiratory distress. She has wheezes. She has no rales.  There are expiratory wheezes bilaterally.  Abdominal: Soft. Bowel sounds are normal. She exhibits no distension. There is no tenderness.  Musculoskeletal: Normal range of motion.  Neurological: She is alert and oriented to person, place, and time.  Skin: Skin is warm and dry. She is not diaphoretic.    ED Course  Procedures (including critical care time) Labs Review Labs Reviewed  CBC - Abnormal; Notable for the following:  WBC 11.9 (*)    All other components within normal limits  BASIC METABOLIC PANEL - Abnormal; Notable for the following:    Glucose, Bld 118 (*)    All other components within normal limits  I-STAT TROPOININ, ED    Imaging Review Dg Chest 2 View (if Patient Has Fever And/or Copd)  08/20/2013   CLINICAL DATA:  Cough, congestion and wheezing.  EXAM: CHEST  2 VIEW  COMPARISON:  None.  FINDINGS: The cardiac silhouette, mediastinal and hilar  contours are normal. The lungs are clear. No pleural effusion. The bony thorax is intact.  IMPRESSION: No acute cardiopulmonary findings.   Electronically Signed   By: Loralie Champagne M.D.   On: 08/20/2013 20:48     EKG Interpretation   Date/Time:  Sunday August 20 2013 19:30:09 EDT Ventricular Rate:  96 PR Interval:  130 QRS Duration: 108 QT Interval:  372 QTC Calculation: 469 R Axis:   42 Text Interpretation:  Normal sinus rhythm Incomplete right bundle branch  block Cannot rule out Anterior infarct , age undetermined Abnormal ECG  Confirmed by DELOS  MD, Christipher Rieger ([id number]) on 08/20/2013 11:22:12 PM      MDM   Final diagnoses:  None    Patient is a 43 year old female who presents with shortness of breath has been worsening over the past 2 weeks. Her chest is congested and she is wheezing. She is reporting productive cough but no fever. Chest x-ray reveals no evidence for pneumonia. Laboratory studies show a mildly elevated white count of 12,000. Troponin and EKG are unremarkable. I will treat her with antibiotics, steroids, and an albuterol inhaler. She is to return if her symptoms worsen or change in for with her doctor she's not improving in the next week.    Geoffery Lyons, MD 08/20/13 [phone]

## 2013-08-20 NOTE — ED Notes (Signed)
MD at bedside. 

## 2013-08-20 NOTE — ED Notes (Signed)
Presents with 2 weeks of SOB, worsening over the last few days. Pt is able to speak in full sentences, audible wheezes, sats 97% RA, winded with minimal exertion. Reports dark brown production from cough and pain in chest with coughing. Wheezes and rales bilaterally.

## 2013-08-20 NOTE — ED Notes (Signed)
Family at bedside. 

## 2013-08-20 NOTE — Discharge Instructions (Signed)
Zithromax as prescribed. Prednisone as prescribed.  Albuterol 2 puffs every 4 hours as needed for wheezing.  Return to the emergency department if your symptoms substantially worsen, and follow up with your Dr. if not improving in the next week.   Acute Bronchitis Bronchitis is inflammation of the airways that extend from the windpipe into the lungs (bronchi). The inflammation often causes mucus to develop. This leads to a cough, which is the most common symptom of bronchitis.  In acute bronchitis, the condition usually develops suddenly and goes away over time, usually in a couple weeks. Smoking, allergies, and asthma can make bronchitis worse. Repeated episodes of bronchitis may cause further lung problems.  CAUSES Acute bronchitis is most often caused by the same virus that causes a cold. The virus can spread from person to person (contagious) through coughing, sneezing, and touching contaminated objects. SIGNS AND SYMPTOMS   Cough.   Fever.   Coughing up mucus.   Body aches.   Chest congestion.   Chills.   Shortness of breath.   Sore throat.  DIAGNOSIS  Acute bronchitis is usually diagnosed through a physical exam. Your health care provider will also ask you questions about your medical history. Tests, such as chest X-rays, are sometimes done to rule out other conditions.  TREATMENT  Acute bronchitis usually goes away in a couple weeks. Oftentimes, no medical treatment is necessary. Medicines are sometimes given for relief of fever or cough. Antibiotic medicines are usually not needed but may be prescribed in certain situations. In some cases, an inhaler may be recommended to help reduce shortness of breath and control the cough. A cool mist vaporizer may also be used to help thin bronchial secretions and make it easier to clear the chest.  HOME CARE INSTRUCTIONS  Get plenty of rest.   Drink enough fluids to keep your urine clear or pale yellow (unless you have a  medical condition that requires fluid restriction). Increasing fluids may help thin your respiratory secretions (sputum) and reduce chest congestion, and it will prevent dehydration.   Take medicines only as directed by your health care provider.  If you were prescribed an antibiotic medicine, finish it all even if you start to feel better.  Avoid smoking and secondhand smoke. Exposure to cigarette smoke or irritating chemicals will make bronchitis worse. If you are a smoker, consider using nicotine gum or skin patches to help control withdrawal symptoms. Quitting smoking will help your lungs heal faster.   Reduce the chances of another bout of acute bronchitis by washing your hands frequently, avoiding people with cold symptoms, and trying not to touch your hands to your mouth, nose, or eyes.   Keep all follow-up visits as directed by your health care provider.  SEEK MEDICAL CARE IF: Your symptoms do not improve after 1 week of treatment.  SEEK IMMEDIATE MEDICAL CARE IF:  You develop an increased fever or chills.   You have chest pain.   You have severe shortness of breath.  You have bloody sputum.   You develop dehydration.  You faint or repeatedly feel like you are going to pass out.  You develop repeated vomiting.  You develop a severe headache. MAKE SURE YOU:   Understand these instructions.  Will watch your condition.  Will get help right away if you are not doing well or get worse. Document Released: 02/20/2004 Document Revised: 05/29/2013 Document Reviewed: 07/05/2012 [facility] Patient Information 2015 [place], [place]. This information is not intended to replace advice given to  you by your health care provider. Make sure you discuss any questions you have with your health care provider.

## 2014-03-21 ENCOUNTER — Encounter (HOSPITAL_COMMUNITY): Payer: Self-pay | Admitting: Family Medicine

## 2014-03-21 ENCOUNTER — Emergency Department (HOSPITAL_COMMUNITY)
Admission: EM | Admit: 2014-03-21 | Discharge: 2014-03-21 | Disposition: A | Discharge status: Home / Self Care | Payer: BLUE CROSS/BLUE SHIELD | Attending: Emergency Medicine | Admitting: Emergency Medicine

## 2014-03-21 DIAGNOSIS — J45909 Unspecified asthma, uncomplicated: Secondary | ICD-10-CM | POA: Diagnosis not present

## 2014-03-21 DIAGNOSIS — Z720 Tobacco use: Secondary | ICD-10-CM | POA: Insufficient documentation

## 2014-03-21 DIAGNOSIS — N390 Urinary tract infection, site not specified: Secondary | ICD-10-CM

## 2014-03-21 DIAGNOSIS — R339 Retention of urine, unspecified: Secondary | ICD-10-CM | POA: Diagnosis present

## 2014-03-21 DIAGNOSIS — Z3202 Encounter for pregnancy test, result negative: Secondary | ICD-10-CM | POA: Diagnosis not present

## 2014-03-21 DIAGNOSIS — Z79899 Other long term (current) drug therapy: Secondary | ICD-10-CM | POA: Insufficient documentation

## 2014-03-21 DIAGNOSIS — K219 Gastro-esophageal reflux disease without esophagitis: Secondary | ICD-10-CM | POA: Diagnosis not present

## 2014-03-21 LAB — URINALYSIS, ROUTINE W REFLEX MICROSCOPIC
BILIRUBIN URINE: NEGATIVE
Glucose, UA: NEGATIVE mg/dL
Ketones, ur: 15 mg/dL — AB
Nitrite: POSITIVE — AB
PH: 6 (ref 5.0–8.0)
Protein, ur: 100 mg/dL — AB
Specific Gravity, Urine: 1.015 (ref 1.005–1.030)
Urobilinogen, UA: 1 mg/dL (ref 0.0–1.0)

## 2014-03-21 LAB — I-STAT CHEM 8, ED
BUN: 9 mg/dL (ref 6–23)
CALCIUM ION: 1.08 mmol/L — AB (ref 1.12–1.23)
CHLORIDE: 103 mmol/L (ref 96–112)
Creatinine, Ser: 0.7 mg/dL (ref 0.50–1.10)
GLUCOSE: 101 mg/dL — AB (ref 70–99)
HEMATOCRIT: 38 % (ref 36.0–46.0)
HEMOGLOBIN: 12.9 g/dL (ref 12.0–15.0)
Potassium: 4.1 mmol/L (ref 3.5–5.1)
Sodium: 137 mmol/L (ref 135–145)
TCO2: 23 mmol/L (ref 0–100)

## 2014-03-21 LAB — URINE MICROSCOPIC-ADD ON

## 2014-03-21 LAB — PREGNANCY, URINE: Preg Test, Ur: NEGATIVE

## 2014-03-21 MED ORDER — SODIUM CHLORIDE 0.9 % IV SOLN: Freq: Once | INTRAVENOUS | Status: DC

## 2014-03-21 MED ORDER — CEPHALEXIN 500 MG PO CAPS: 500.0000 mg | ORAL_CAPSULE | Freq: Four times a day (QID) | ORAL | Status: DC

## 2014-03-21 MED ORDER — TRAMADOL HCL 50 MG PO TABS: 50.0000 mg | ORAL_TABLET | Freq: Four times a day (QID) | ORAL | Status: DC | PRN

## 2014-03-21 MED ORDER — HYDROCODONE-ACETAMINOPHEN 5-325 MG PO TABS
1.0000 | ORAL_TABLET | Freq: Once | ORAL | Status: AC
  Administered 2014-03-21: 1 via ORAL
  Filled 2014-03-21: qty 1

## 2014-03-21 MED ORDER — DEXTROSE 5 % IV SOLN
1.0000 g | Freq: Once | INTRAVENOUS | Status: AC
  Administered 2014-03-21: 1 g via INTRAVENOUS
  Filled 2014-03-21: qty 10

## 2014-03-21 NOTE — Discharge Instructions (Signed)
Drink plenty of fluids and follow up in 1-2 weeks

## 2014-03-21 NOTE — ED Notes (Signed)
Pt is stable upon d/c and ambulates from ED escorted by staff.

## 2014-03-21 NOTE — ED Notes (Signed)
Bladder scan shows 69mL of urine in bladder.

## 2014-03-21 NOTE — ED Provider Notes (Signed)
CSN: [id number]     Arrival date & time 03/21/14  1421 History   First MD Initiated Contact with Patient 03/21/14 1704     Chief Complaint  Patient presents with  . Urinary Retention     (Consider location/radiation/quality/duration/timing/severity/associated sxs/prior Treatment) Patient is a 44 y.o. female presenting with dysuria. The history is provided by the patient (pt complains of dysuria).  Dysuria Pain quality:  Aching Pain severity:  Moderate Onset quality:  Gradual Timing:  Constant Progression:  Waxing and waning Chronicity:  New Recent urinary tract infections: no   Relieved by:  Nothing Worsened by:  Nothing tried Urinary symptoms: no discolored urine   Associated symptoms: no abdominal pain     Past Medical History  Diagnosis Date  . Asthma   . GERD (gastroesophageal reflux disease)    History reviewed. No pertinent past surgical history. History reviewed. No pertinent family history. History  Substance Use Topics  . Smoking status: Current Every Day Smoker    Types: Cigarettes  . Smokeless tobacco: Never Used  . Alcohol Use: No   OB History    No data available     Review of Systems  Constitutional: Negative for appetite change and fatigue.  HENT: Negative for congestion, ear discharge and sinus pressure.   Eyes: Negative for discharge.  Respiratory: Negative for cough.   Cardiovascular: Negative for chest pain.  Gastrointestinal: Negative for abdominal pain and diarrhea.  Genitourinary: Positive for dysuria. Negative for frequency and hematuria.  Musculoskeletal: Negative for back pain.  Skin: Negative for rash.  Neurological: Negative for seizures and headaches.  Psychiatric/Behavioral: Negative for hallucinations.      Allergies  Review of patient's allergies indicates no known allergies.  Home Medications   Prior to Admission medications   Medication Sig Start Date End Date Taking? Authorizing Provider  omeprazole (PRILOSEC) 20 MG  capsule Take 20 mg by mouth at bedtime.    Yes Historical Provider, MD  albuterol (PROVENTIL HFA;VENTOLIN HFA) 108 (90 BASE) MCG/ACT inhaler Inhale 2 puffs into the lungs every 4 (four) hours as needed for wheezing or shortness of breath. 08/20/13   Geoffery Lyons, MD  cephALEXin (KEFLEX) 500 MG capsule Take 1 capsule (500 mg total) by mouth 4 (four) times daily. 03/21/14   Benny Lennert, MD  dextromethorphan-guaiFENesin [facility] DM) 30-600 MG per 12 hr tablet Take 1 tablet by mouth 2 (two) times daily as needed for cough.    Historical Provider, MD  traMADol (ULTRAM) 50 MG tablet Take 1 tablet (50 mg total) by mouth every 6 (six) hours as needed. 03/21/14   Benny Lennert, MD   BP 94/58 mmHg  Pulse 85  Temp(Src) 98.4 F (36.9 C) (Oral)  Resp 17  SpO2 98% Physical Exam  Constitutional: She is oriented to person, place, and time. She appears well-developed.  HENT:  Head: Normocephalic.  Eyes: Conjunctivae and EOM are normal. No scleral icterus.  Neck: Neck supple. No thyromegaly present.  Cardiovascular: Normal rate and regular rhythm.  Exam reveals no gallop and no friction rub.   No murmur heard. Pulmonary/Chest: No stridor. She has no wheezes. She has no rales. She exhibits no tenderness.  Abdominal: She exhibits no distension. There is no tenderness. There is no rebound.  Genitourinary:  Mild tender suprapubic  Musculoskeletal: Normal range of motion. She exhibits no edema.  Lymphadenopathy:    She has no cervical adenopathy.  Neurological: She is oriented to person, place, and time. She exhibits normal muscle tone. Coordination normal.  Skin: No rash noted. No erythema.  Psychiatric: She has a normal mood and affect. Her behavior is normal.    ED Course  Procedures (including critical care time) Labs Review Labs Reviewed  URINALYSIS, ROUTINE W REFLEX MICROSCOPIC - Abnormal; Notable for the following:    Color, Urine ORANGE (*)    APPearance CLOUDY (*)    Hgb urine dipstick  LARGE (*)    Ketones, ur 15 (*)    Protein, ur 100 (*)    Nitrite POSITIVE (*)    Leukocytes, UA LARGE (*)    All other components within normal limits  URINE MICROSCOPIC-ADD ON - Abnormal; Notable for the following:    Squamous Epithelial / LPF MANY (*)    Bacteria, UA MANY (*)    All other components within normal limits  I-STAT CHEM 8, ED - Abnormal; Notable for the following:    Glucose, Bld 101 (*)    Calcium, Ion 1.08 (*)    All other components within normal limits  URINE CULTURE  PREGNANCY, URINE    Imaging Review No results found.   EKG Interpretation None      MDM   Final diagnoses:  UTI (lower urinary tract infection)    Uti,   tx with bactrim and ultram    Benny LennertJoseph L Sheddrick Lattanzio, MD 03/21/14 1815

## 2014-03-21 NOTE — ED Notes (Signed)
Pt here for urinary retention and lower back pain.

## 2014-03-23 LAB — URINE CULTURE: Colony Count: >=100000

## 2014-03-26 ENCOUNTER — Telehealth (HOSPITAL_COMMUNITY): Payer: Self-pay

## 2014-03-26 NOTE — Telephone Encounter (Signed)
Post ED Visit - Positive Culture Follow-up  Culture report reviewed by antimicrobial stewardship pharmacist: []  Wes Dulaney, Pharm.D., BCPS []  Celedonio MiyamotoJeremy Frens, Pharm.D., BCPS [x]  Georgina PillionElizabeth Martin, Pharm.D., BCPS []  GillettMinh Pham, [address]Pharm[address]D., BCPS, AAHIVP []  Estella HuskMichelle Turner, Pharm.D., BCPS, AAHIVP []  Elder CyphersLorie Poole, [address]Pharm[address]D., BCPS  Positive Urine culture, >/= 100,000 colonies -> E Coli Treated with Cephalexin, organism sensitive to the same and no further patient follow-up is required at this time.  Wendy Bradley, Wendy Bradley 03/26/2014, 5:32 AM

## 2014-07-25 ENCOUNTER — Encounter (HOSPITAL_COMMUNITY): Payer: Self-pay | Admitting: *Deleted

## 2014-07-25 ENCOUNTER — Emergency Department (HOSPITAL_COMMUNITY)
Admission: EM | Admit: 2014-07-25 | Discharge: 2014-07-25 | Disposition: A | Discharge status: Home / Self Care | Payer: BLUE CROSS/BLUE SHIELD | Attending: Emergency Medicine | Admitting: Emergency Medicine

## 2014-07-25 DIAGNOSIS — H9209 Otalgia, unspecified ear: Secondary | ICD-10-CM | POA: Diagnosis not present

## 2014-07-25 DIAGNOSIS — Z792 Long term (current) use of antibiotics: Secondary | ICD-10-CM | POA: Diagnosis not present

## 2014-07-25 DIAGNOSIS — J209 Acute bronchitis, unspecified: Secondary | ICD-10-CM

## 2014-07-25 DIAGNOSIS — Z79899 Other long term (current) drug therapy: Secondary | ICD-10-CM | POA: Insufficient documentation

## 2014-07-25 DIAGNOSIS — E669 Obesity, unspecified: Secondary | ICD-10-CM | POA: Insufficient documentation

## 2014-07-25 DIAGNOSIS — K219 Gastro-esophageal reflux disease without esophagitis: Secondary | ICD-10-CM | POA: Diagnosis not present

## 2014-07-25 DIAGNOSIS — Z720 Tobacco use: Secondary | ICD-10-CM | POA: Diagnosis not present

## 2014-07-25 DIAGNOSIS — J45901 Unspecified asthma with (acute) exacerbation: Secondary | ICD-10-CM | POA: Insufficient documentation

## 2014-07-25 DIAGNOSIS — R05 Cough: Secondary | ICD-10-CM | POA: Diagnosis present

## 2014-07-25 HISTORY — DX: Obesity, unspecified: E66.9

## 2014-07-25 MED ORDER — BENZONATATE 200 MG PO CAPS
200.0000 mg | ORAL_CAPSULE | Freq: Three times a day (TID) | ORAL | Status: DC | PRN
Start: 2014-07-25 — End: 2016-03-12

## 2014-07-25 MED ORDER — ALBUTEROL SULFATE HFA 108 (90 BASE) MCG/ACT IN AERS
2.0000 | INHALATION_SPRAY | Freq: Once | RESPIRATORY_TRACT | Status: AC
  Administered 2014-07-25: 2 via RESPIRATORY_TRACT
  Filled 2014-07-25: qty 6.7

## 2014-07-25 MED ORDER — PREDNISONE 20 MG PO TABS: 40.0000 mg | ORAL_TABLET | Freq: Every day | ORAL | Status: DC

## 2014-07-25 MED ORDER — OMEPRAZOLE 20 MG PO CPDR: 20.0000 mg | DELAYED_RELEASE_CAPSULE | Freq: Every day | ORAL | Status: DC

## 2014-07-25 NOTE — Discharge Instructions (Signed)
Acute Bronchitis Bronchitis is inflammation of the airways that extend from the windpipe into the lungs (bronchi). The inflammation often causes mucus to develop. This leads to a cough, which is the most common symptom of bronchitis.  In acute bronchitis, the condition usually develops suddenly and goes away over time, usually in a couple weeks. Smoking, allergies, and asthma can make bronchitis worse. Repeated episodes of bronchitis may cause further lung problems.  CAUSES Acute bronchitis is most often caused by the same virus that causes a cold. The virus can spread from person to person (contagious) through coughing, sneezing, and touching contaminated objects. SIGNS AND SYMPTOMS   Cough.   Fever.   Coughing up mucus.   Body aches.   Chest congestion.   Chills.   Shortness of breath.   Sore throat.  DIAGNOSIS  Acute bronchitis is usually diagnosed through a physical exam. Your health care provider will also ask you questions about your medical history. Tests, such as chest X-rays, are sometimes done to rule out other conditions.  TREATMENT  Acute bronchitis usually goes away in a couple weeks. Oftentimes, no medical treatment is necessary. Medicines are sometimes given for relief of fever or cough. Antibiotic medicines are usually not needed but may be prescribed in certain situations. In some cases, an inhaler may be recommended to help reduce shortness of breath and control the cough. A cool mist vaporizer may also be used to help thin bronchial secretions and make it easier to clear the chest.  HOME CARE INSTRUCTIONS  Get plenty of rest.   Drink enough fluids to keep your urine clear or pale yellow (unless you have a medical condition that requires fluid restriction). Increasing fluids may help thin your respiratory secretions (sputum) and reduce chest congestion, and it will prevent dehydration.   Take medicines only as directed by your health care provider.  If  you were prescribed an antibiotic medicine, finish it all even if you start to feel better.  Avoid smoking and secondhand smoke. Exposure to cigarette smoke or irritating chemicals will make bronchitis worse. If you are a smoker, consider using nicotine gum or skin patches to help control withdrawal symptoms. Quitting smoking will help your lungs heal faster.   Reduce the chances of another bout of acute bronchitis by washing your hands frequently, avoiding people with cold symptoms, and trying not to touch your hands to your mouth, nose, or eyes.   Keep all follow-up visits as directed by your health care provider.  SEEK MEDICAL CARE IF: Your symptoms do not improve after 1 week of treatment.  SEEK IMMEDIATE MEDICAL CARE IF:  You develop an increased fever or chills.   You have chest pain.   You have severe shortness of breath.  You have bloody sputum.   You develop dehydration.  You faint or repeatedly feel like you are going to pass out.  You develop repeated vomiting.  You develop a severe headache. MAKE SURE YOU:   Understand these instructions.  Will watch your condition.  Will get help right away if you are not doing well or get worse. Document Released: 02/20/2004 Document Revised: 05/29/2013 Document Reviewed: 07/05/2012 [facility] Patient Information 2015 [place], [place]. This information is not intended to replace advice given to you by your health care provider. Make sure you discuss any questions you have with your health care provider. Cough, Adult  A cough is a reflex that helps clear your throat and airways. It can help heal the body or may be  a reaction to an irritated airway. A cough may only last 2 or 3 weeks (acute) or may last more than 8 weeks (chronic).  CAUSES Acute cough:  Viral or bacterial infections. Chronic cough:  Infections.  Allergies.  Asthma.  Post-nasal drip.  Smoking.  Heartburn or acid reflux.  Some medicines.  Chronic  lung problems (COPD).  Cancer. SYMPTOMS   Cough.  Fever.  Chest pain.  Increased breathing rate.  High-pitched whistling sound when breathing (wheezing).  Colored mucus that you cough up (sputum). TREATMENT   A bacterial cough may be treated with antibiotic medicine.  A viral cough must run its course and will not respond to antibiotics.  Your caregiver may recommend other treatments if you have a chronic cough. HOME CARE INSTRUCTIONS   Only take over-the-counter or prescription medicines for pain, discomfort, or fever as directed by your caregiver. Use cough suppressants only as directed by your caregiver.  Use a cold steam vaporizer or humidifier in your bedroom or home to help loosen secretions.  Sleep in a semi-upright position if your cough is worse at night.  Rest as needed.  Stop smoking if you smoke. SEEK IMMEDIATE MEDICAL CARE IF:   You have pus in your sputum.  Your cough starts to worsen.  You cannot control your cough with suppressants and are losing sleep.  You begin coughing up blood.  You have difficulty breathing.  You develop pain which is getting worse or is uncontrolled with medicine.  You have a fever. MAKE SURE YOU:   Understand these instructions.  Will watch your condition.  Will get help right away if you are not doing well or get worse. Document Released: 07/11/2010 Document Revised: 04/06/2011 Document Reviewed: 07/11/2010 [facility] Patient Information 2015 [place], [place]. This information is not intended to replace advice given to you by your health care provider. Make sure you discuss any questions you have with your health care provider. Gastroesophageal Reflux Disease, Adult Gastroesophageal reflux disease (GERD) happens when acid from your stomach flows up into the esophagus. When acid comes in contact with the esophagus, the acid causes soreness (inflammation) in the esophagus. Over time, GERD may create small holes (ulcers)  in the lining of the esophagus. CAUSES   Increased body weight. This puts pressure on the stomach, making acid rise from the stomach into the esophagus.  Smoking. This increases acid production in the stomach.  Drinking alcohol. This causes decreased pressure in the lower esophageal sphincter (valve or ring of muscle between the esophagus and stomach), allowing acid from the stomach into the esophagus.  Late evening meals and a full stomach. This increases pressure and acid production in the stomach.  A malformed lower esophageal sphincter. Sometimes, no cause is found. SYMPTOMS   Burning pain in the lower part of the mid-chest behind the breastbone and in the mid-stomach area. This may occur twice a week or more often.  Trouble swallowing.  Sore throat.  Dry cough.  Asthma-like symptoms including chest tightness, shortness of breath, or wheezing. DIAGNOSIS  Your caregiver may be able to diagnose GERD based on your symptoms. In some cases, X-rays and other tests may be done to check for complications or to check the condition of your stomach and esophagus. TREATMENT  Your caregiver may recommend over-the-counter or prescription medicines to help decrease acid production. Ask your caregiver before starting or adding any new medicines.  HOME CARE INSTRUCTIONS   Change the factors that you can control. Ask your caregiver for guidance concerning  weight loss, quitting smoking, and alcohol consumption.  Avoid foods and drinks that make your symptoms worse, such as:  Caffeine or alcoholic drinks.  Chocolate.  Peppermint or mint flavorings.  Garlic and onions.  Spicy foods.  Citrus fruits, such as oranges, lemons, or limes.  Tomato-based foods such as sauce, chili, salsa, and pizza.  Fried and fatty foods.  Avoid lying down for the 3 hours prior to your bedtime or prior to taking a nap.  Eat small, frequent meals instead of large meals.  Wear loose-fitting clothing. Do  not wear anything tight around your waist that causes pressure on your stomach.  Raise the head of your bed 6 to 8 inches with wood blocks to help you sleep. Extra pillows will not help.  Only take over-the-counter or prescription medicines for pain, discomfort, or fever as directed by your caregiver.  Do not take aspirin, ibuprofen, or other nonsteroidal anti-inflammatory drugs (NSAIDs). SEEK IMMEDIATE MEDICAL CARE IF:   You have pain in your arms, neck, jaw, teeth, or back.  Your pain increases or changes in intensity or duration.  You develop nausea, vomiting, or sweating (diaphoresis).  You develop shortness of breath, or you faint.  Your vomit is green, yellow, black, or looks like coffee grounds or blood.  Your stool is red, bloody, or black. These symptoms could be signs of other problems, such as heart disease, gastric bleeding, or esophageal bleeding. MAKE SURE YOU:   Understand these instructions.  Will watch your condition.  Will get help right away if you are not doing well or get worse. Document Released: 10/22/2004 Document Revised: 04/06/2011 Document Reviewed: 08/01/2010 [facility] Patient Information 2015 [place], [place]. This information is not intended to replace advice given to you by your health care provider. Make sure you discuss any questions you have with your health care provider.

## 2014-07-25 NOTE — ED Notes (Signed)
Pt reports having non productive cough x 3 weeks. Airway intact. spo2 97%.

## 2014-07-25 NOTE — ED Provider Notes (Signed)
CSN: [id number]     Arrival date & time 07/25/14  1507 History  This chart was scribed for Wendy FarrierWilliam Lasonja Lakins, PA-C, working with Wendy LovelessScott Goldston, MD by Chestine SporeSoijett Bradley, ED Scribe. The patient was seen in room TR07C/TR07C at 4:12 PM.    Chief Complaint  Patient presents with  . Cough   The history is provided by the patient. No language interpreter was used.   HPI Comments: Wendy Bradley is a 44 y.o. female with a medical hx of bronchitis who presents to the Emergency Department complaining of a dry cough onset 3 weeks with worsening 3 days ago and concurrent onset of post-tussive emesis.  Within the past several days, she has also developed associated congestion, right ear pain, and wheezes. Pt reports that she used to have an inhaler but she has since ran out.  She reports a history of bronchitis frequently in the summer and reports this episode feels similar. She has used Robitussin and Mucinex without relief.  She denies sick contacts. She denies fever, chills, abdominal pain, sinus pressure, sore throat, SOB, CP, palpitations, abdominal pain, n/v, ear drainage, and rash.  Pt reports that she is a former smoker who quit 1 year ago.   Past Medical History  Diagnosis Date  . Asthma   . GERD (gastroesophageal reflux disease)   . Obesity    History reviewed. No pertinent past surgical history. History reviewed. No pertinent family history. History  Substance Use Topics  . Smoking status: Current Every Day Smoker    Types: Cigarettes  . Smokeless tobacco: Never Used  . Alcohol Use: No   OB History    No data available     Review of Systems  Constitutional: Negative for fever and chills.  HENT: Positive for congestion and ear pain. Negative for ear discharge, rhinorrhea and sinus pressure.   Eyes: Negative for discharge and itching.  Respiratory: Positive for cough and wheezing. Negative for chest tightness and shortness of breath.   Cardiovascular: Negative for chest pain and  palpitations.  Gastrointestinal: Negative for nausea, vomiting and abdominal pain.  Skin: Negative for rash.      Allergies  Review of patient's allergies indicates no known allergies.  Home Medications   Prior to Admission medications   Medication Sig Start Date End Date Taking? Authorizing Provider  albuterol (PROVENTIL HFA;VENTOLIN HFA) 108 (90 BASE) MCG/ACT inhaler Inhale 2 puffs into the lungs every 4 (four) hours as needed for wheezing or shortness of breath. 08/20/13   Geoffery Lyonsouglas Delo, MD  cephALEXin (KEFLEX) 500 MG capsule Take 1 capsule (500 mg total) by mouth 4 (four) times daily. 03/21/14   Wendy BerkshireJoseph Zammit, MD  dextromethorphan-guaiFENesin [facility](MUCINEX DM) 30-600 MG per 12 hr tablet Take 1 tablet by mouth 2 (two) times daily as needed for cough.    Historical Provider, MD  omeprazole (PRILOSEC) 20 MG capsule Take 20 mg by mouth at bedtime.     Historical Provider, MD  traMADol (ULTRAM) 50 MG tablet Take 1 tablet (50 mg total) by mouth every 6 (six) hours as needed. 03/21/14   Wendy BerkshireJoseph Zammit, MD   BP 148/90 mmHg  Pulse 100  Temp(Src) 98.2 F (36.8 C) (Oral)  Resp 20  SpO2 99% Physical Exam  Constitutional: She appears well-developed and well-nourished. No distress.  Nontoxic appearing.  HENT:  Head: Normocephalic and atraumatic.  Right Ear: Tympanic membrane and external ear normal. Tympanic membrane is not erythematous.  Left Ear: Tympanic membrane and external ear normal. Tympanic membrane is not erythematous.  Nose: Nose normal.  Mouth/Throat: Oropharynx is clear and moist and mucous membranes are normal. No oropharyngeal exudate.  TM are pearly gray without loss of landmarks and no erythema.   Eyes: Conjunctivae are normal. Pupils are equal, round, and reactive to light. Right eye exhibits no discharge. Left eye exhibits no discharge.  Neck: Neck supple.  Cardiovascular: Normal rate, regular rhythm, normal heart sounds and intact distal pulses.   Heart rate is 88.    Pulmonary/Chest: Effort normal and breath sounds normal. No respiratory distress. She has no wheezes. She has no rales. She exhibits no tenderness.  Lungs are clear to auscultation bilaterally. No wheezing or rhonchi.   Abdominal: Soft. There is no tenderness.  Musculoskeletal: She exhibits no edema or tenderness.  No lower extremity edema or tenderness.  Lymphadenopathy:    She has no cervical adenopathy.  Neurological: She is alert. Coordination normal.  Skin: Skin is warm and dry. No rash noted. She is not diaphoretic. No erythema.  Psychiatric: She has a normal mood and affect. Her behavior is normal.  Nursing note and vitals reviewed.   ED Course  Procedures (including critical care time)  DIAGNOSTIC STUDIES: Oxygen Saturation is 99% on RA, normal by my interpretation.    COORDINATION OF CARE:  4:18 PM- CXR was offered to patient and she declined.    4:21 PM-Discussed treatment plan which includes steroids, albuterol inhaler, tessalon perles Rx, Proton pump inhibitor Rx and f/u with PCP with pt at bedside and pt agreed to plan.    Labs Review Labs Reviewed - No data to display  Imaging Review No results found.   EKG Interpretation None      Filed Vitals:   07/25/14 1515  BP: 148/90  Pulse: 100  Temp: 98.2 F (36.8 C)  TempSrc: Oral  Resp: 20  SpO2: 99%     MDM   Meds given in ED:  Medications  albuterol (PROVENTIL HFA;VENTOLIN HFA) 108 (90 BASE) MCG/ACT inhaler 2 puff (2 puffs Inhalation Given 07/25/14 1630)    New Prescriptions   BENZONATATE (TESSALON) 200 MG CAPSULE    Take 1 capsule (200 mg total) by mouth 3 (three) times daily as needed for cough.   OMEPRAZOLE (PRILOSEC) 20 MG CAPSULE    Take 1 capsule (20 mg total) by mouth daily.   PREDNISONE (DELTASONE) 20 MG TABLET    Take 2 tablets (40 mg total) by mouth daily.    Final diagnoses:  Acute bronchitis, unspecified organism  Gastroesophageal reflux disease, esophagitis presence not specified     This is a 44 year old female with a history of bronchitis presents to the emergency department complaining of a dry cough ongoing for the past 3 weeks. She reports is worse in the past 3 days. She reports this feels like her bronchitis which she has had last year. She also complains of increased symptoms of acid reflux recently. She denies feeling ill and denies fevers, chills, productive cough, chest pain, or shortness of breath. On exam patient is afebrile and nontoxic appearing. She does have a slight dry cough during the interview. Her lungs are clear to auscultation bilaterally. No wheezes or rhonchi noted. I suggested doing a chest x-ray but patient declines. We'll discharge the patient with prescriptions for Tessalon, Prilosec, prednisone. Patient also given albuterol inhaler in ED. Strict return precautions provided. I advised the patient to follow-up with their primary care provider this week. I advised the patient to return to the emergency department with new or worsening symptoms or  new concerns. The patient verbalized understanding and agreement with plan.    I personally performed the services described in this documentation, which was scribed in my presence. The recorded information has been reviewed and is accurate.    Wendy Farrier, PA-C 07/25/14 1642  Wendy Loveless, MD 07/26/14 [id number]

## 2014-11-01 ENCOUNTER — Encounter (HOSPITAL_COMMUNITY): Payer: Self-pay | Admitting: *Deleted

## 2014-11-01 ENCOUNTER — Emergency Department (INDEPENDENT_AMBULATORY_CARE_PROVIDER_SITE_OTHER): Payer: BLUE CROSS/BLUE SHIELD

## 2014-11-01 ENCOUNTER — Emergency Department (INDEPENDENT_AMBULATORY_CARE_PROVIDER_SITE_OTHER)
Admission: EM | Admit: 2014-11-01 | Discharge: 2014-11-01 | Disposition: A | Discharge status: Home / Self Care | Payer: BLUE CROSS/BLUE SHIELD | Source: Home / Self Care | Attending: Family Medicine | Admitting: Family Medicine

## 2014-11-01 DIAGNOSIS — M7651 Patellar tendinitis, right knee: Secondary | ICD-10-CM

## 2014-11-01 MED ORDER — NAPROXEN 500 MG PO TABS: 500.0000 mg | ORAL_TABLET | Freq: Two times a day (BID) | ORAL | Status: DC

## 2014-11-01 NOTE — ED Notes (Signed)
Pt has  Pain in  Her  r  Knee       X  3  Days   denys  Any  specefic  Injury   But  She  Repots  The  Pain is  Worse  On  Weight  Bearing

## 2014-11-01 NOTE — ED Provider Notes (Signed)
CSN: [id number]     Arrival date & time 11/01/14  1521 History   None    Chief Complaint  Patient presents with  . Knee Pain   (Consider location/radiation/quality/duration/timing/severity/associated sxs/prior Treatment) Patient is a 44 y.o. female presenting with knee pain. The history is provided by the patient.  Knee Pain Location:  Knee Time since incident:  3 days Injury: no   Knee location:  R knee Pain details:    Quality:  Sharp and shooting   Radiates to:  Does not radiate   Severity:  Mild   Onset quality:  Gradual   Timing:  Intermittent   Progression:  Unchanged Chronicity:  New Dislocation: no   Prior injury to area:  No Associated symptoms: muscle weakness   Associated symptoms: no back pain, no decreased ROM, no fever and no swelling   Associated symptoms comment:  Feeling of knee giving out, worse with getting out of chair or using steps. Risk factors: obesity     Past Medical History  Diagnosis Date  . Asthma   . GERD (gastroesophageal reflux disease)   . Obesity    History reviewed. No pertinent past surgical history. History reviewed. No pertinent family history. Social History  Substance Use Topics  . Smoking status: Current Every Day Smoker    Types: Cigarettes  . Smokeless tobacco: Never Used  . Alcohol Use: No   OB History    No data available     Review of Systems  Constitutional: Negative for fever.  Musculoskeletal: Positive for arthralgias and gait problem. Negative for myalgias, back pain and joint swelling.  Skin: Negative.   All other systems reviewed and are negative.   Allergies  Review of patient's allergies indicates no known allergies.  Home Medications   Prior to Admission medications   Medication Sig Start Date End Date Taking? Authorizing Provider  albuterol (PROVENTIL HFA;VENTOLIN HFA) 108 (90 BASE) MCG/ACT inhaler Inhale 2 puffs into the lungs every 4 (four) hours as needed for wheezing or shortness of breath.  08/20/13   Geoffery Lyons, MD  benzonatate (TESSALON) 200 MG capsule Take 1 capsule (200 mg total) by mouth 3 (three) times daily as needed for cough. 07/25/14   [name], PA-C  cephALEXin (KEFLEX) 500 MG capsule Take 1 capsule (500 mg total) by mouth 4 (four) times daily. 03/21/14   Bethann Berkshire, MD  dextromethorphan-guaiFENesin [facility] DM) 30-600 MG per 12 hr tablet Take 1 tablet by mouth 2 (two) times daily as needed for cough.    Historical Provider, MD  omeprazole (PRILOSEC) 20 MG capsule Take 1 capsule (20 mg total) by mouth daily. 07/25/14   [name], PA-C  predniSONE (DELTASONE) 20 MG tablet Take 2 tablets (40 mg total) by mouth daily. 07/25/14   [name], PA-C  traMADol (ULTRAM) 50 MG tablet Take 1 tablet (50 mg total) by mouth every 6 (six) hours as needed. 03/21/14   Bethann Berkshire, MD   Meds Ordered and Administered this Visit  Medications - No data to display  There were no vitals taken for this visit. No data found.   Physical Exam  Constitutional: She is oriented to person, place, and time. She appears well-developed and well-nourished. No distress.  Musculoskeletal: She exhibits tenderness.       Right knee: She exhibits abnormal patellar mobility. She exhibits normal range of motion, no swelling, no effusion, no deformity, no LCL laxity, no bony tenderness, normal meniscus and no MCL laxity. Tenderness found. Medial joint line, lateral  joint line and patellar tendon tenderness noted.  Neurological: She is alert and oriented to person, place, and time.  Skin: Skin is warm and dry.  Nursing note and vitals reviewed.   ED Course  Procedures (including critical care time)  Labs Review Labs Reviewed - No data to display  Imaging Review No results found.   Visual Acuity Review  Right Eye Distance:   Left Eye Distance:   Bilateral Distance:    Right Eye Near:   Left Eye Near:    Bilateral Near:         MDM  No diagnosis found.     Linna Hoff, MD 11/01/14 [id number]

## 2014-11-01 NOTE — Discharge Instructions (Signed)
Ice pack, medicine, and support as needed, see orthopedist if further problems.

## 2014-11-21 ENCOUNTER — Other Ambulatory Visit: Payer: Self-pay | Admitting: Sports Medicine

## 2014-11-21 DIAGNOSIS — M25561 Pain in right knee: Secondary | ICD-10-CM

## 2014-12-07 ENCOUNTER — Ambulatory Visit
Admission: RE | Admit: 2014-12-07 | Discharge: 2014-12-07 | Disposition: A | Discharge status: Home / Self Care | Payer: BLUE CROSS/BLUE SHIELD | Source: Ambulatory Visit | Attending: Sports Medicine | Admitting: Sports Medicine

## 2014-12-07 DIAGNOSIS — M25561 Pain in right knee: Secondary | ICD-10-CM

## 2015-04-10 ENCOUNTER — Emergency Department (INDEPENDENT_AMBULATORY_CARE_PROVIDER_SITE_OTHER)
Admission: EM | Admit: 2015-04-10 | Discharge: 2015-04-10 | Disposition: A | Discharge status: Home / Self Care | Payer: BLUE CROSS/BLUE SHIELD | Source: Home / Self Care | Attending: Family Medicine | Admitting: Family Medicine

## 2015-04-10 ENCOUNTER — Encounter (HOSPITAL_COMMUNITY): Payer: Self-pay | Admitting: *Deleted

## 2015-04-10 DIAGNOSIS — J069 Acute upper respiratory infection, unspecified: Secondary | ICD-10-CM | POA: Diagnosis not present

## 2015-04-10 MED ORDER — PSEUDOEPH-BROMPHEN-DM 30-2-10 MG/5ML PO SYRP: 10.0000 mL | ORAL_SOLUTION | Freq: Four times a day (QID) | ORAL | Status: DC | PRN

## 2015-04-10 MED ORDER — IPRATROPIUM BROMIDE 0.06 % NA SOLN: 2.0000 | Freq: Four times a day (QID) | NASAL | Status: DC

## 2015-04-10 NOTE — ED Provider Notes (Signed)
CSN: [id number]     Arrival date & time 04/10/15  1859 History   First MD Initiated Contact with Patient 04/10/15 2005     Chief Complaint  Patient presents with  . URI   (Consider location/radiation/quality/duration/timing/severity/associated sxs/prior Treatment) Patient is a 45 y.o. female presenting with URI. The history is provided by the patient.  URI Presenting symptoms: congestion, cough and rhinorrhea   Presenting symptoms: no fever   Severity:  Moderate Onset quality:  Sudden Duration:  2 days Progression:  Worsening Chronicity:  New Relieved by:  None tried Worsened by:  Nothing tried Ineffective treatments:  None tried Associated symptoms: no wheezing   Risk factors: recent travel and sick contacts   Risk factors comment:  Driving to [place] recently.   Past Medical History  Diagnosis Date  . Asthma   . GERD (gastroesophageal reflux disease)   . Obesity    No past surgical history on file. No family history on file. Social History  Substance Use Topics  . Smoking status: Current Every Day Smoker    Types: Cigarettes  . Smokeless tobacco: Never Used  . Alcohol Use: No   OB History    No data available     Review of Systems  Constitutional: Negative.  Negative for fever.  HENT: Positive for congestion, postnasal drip and rhinorrhea.   Respiratory: Positive for cough. Negative for shortness of breath and wheezing.   Cardiovascular: Negative.   Gastrointestinal: Negative.   All other systems reviewed and are negative.   Allergies  Review of patient's allergies indicates no known allergies.  Home Medications   Prior to Admission medications   Medication Sig Start Date End Date Taking? Authorizing Provider  albuterol (PROVENTIL HFA;VENTOLIN HFA) 108 (90 BASE) MCG/ACT inhaler Inhale 2 puffs into the lungs every 4 (four) hours as needed for wheezing or shortness of breath. 08/20/13   Geoffery Lyons, MD  benzonatate (TESSALON) 200 MG capsule Take 1 capsule  (200 mg total) by mouth 3 (three) times daily as needed for cough. 07/25/14   [name], PA-C  cephALEXin (KEFLEX) 500 MG capsule Take 1 capsule (500 mg total) by mouth 4 (four) times daily. 03/21/14   Bethann Berkshire, MD  dextromethorphan-guaiFENesin [facility] DM) 30-600 MG per 12 hr tablet Take 1 tablet by mouth 2 (two) times daily as needed for cough.    Historical Provider, MD  naproxen (NAPROSYN) 500 MG tablet Take 1 tablet (500 mg total) by mouth 2 (two) times daily. 11/01/14   Linna Hoff, MD  omeprazole (PRILOSEC) 20 MG capsule Take 1 capsule (20 mg total) by mouth daily. 07/25/14   [name], PA-C  predniSONE (DELTASONE) 20 MG tablet Take 2 tablets (40 mg total) by mouth daily. 07/25/14   [name], PA-C  traMADol (ULTRAM) 50 MG tablet Take 1 tablet (50 mg total) by mouth every 6 (six) hours as needed. 03/21/14   Bethann Berkshire, MD   Meds Ordered and Administered this Visit  Medications - No data to display  There were no vitals taken for this visit. No data found.   Physical Exam  Constitutional: She is oriented to person, place, and time. She appears well-developed and well-nourished.  HENT:  Head: Normocephalic.  Right Ear: External ear normal.  Left Ear: External ear normal.  Nose: Mucosal edema and rhinorrhea present.  Mouth/Throat: Oropharynx is clear and moist.  Neck: Normal range of motion. Neck supple.  Cardiovascular: Normal rate, regular rhythm, normal heart sounds and intact distal pulses.  Pulmonary/Chest: Effort normal and breath sounds normal.  Lymphadenopathy:    She has no cervical adenopathy.  Neurological: She is alert and oriented to person, place, and time.  Skin: Skin is warm and dry.  Nursing note and vitals reviewed.   ED Course  Procedures (including critical care time)  Labs Review Labs Reviewed - No data to display  Imaging Review No results found.   Visual Acuity Review  Right Eye Distance:   Left Eye Distance:   Bilateral  Distance:    Right Eye Near:   Left Eye Near:    Bilateral Near:         MDM   Meds ordered this encounter  Medications  . ipratropium (ATROVENT) 0.06 % nasal spray    Sig: Place 2 sprays into both nostrils 4 (four) times daily.    Dispense:  15 mL    Refill:  1  . brompheniramine-pseudoephedrine-DM 30-2-10 MG/5ML syrup    Sig: Take 10 mLs by mouth 4 (four) times daily as needed.    Dispense:  120 mL    Refill:  1        Linna HoffJames D Basha Krygier, MD 04/10/15 2021

## 2015-04-10 NOTE — ED Notes (Signed)
Pt     Reports        Head   Congested         With         Hoarseness  And        Sinus  Drainage  /  Stuffy  Nose   Symptoms  Not  releived  By otc  meds

## 2015-12-16 ENCOUNTER — Encounter (HOSPITAL_COMMUNITY): Payer: Self-pay | Admitting: Emergency Medicine

## 2015-12-16 ENCOUNTER — Ambulatory Visit (HOSPITAL_COMMUNITY)
Admission: EM | Admit: 2015-12-16 | Discharge: 2015-12-16 | Disposition: A | Discharge status: Home / Self Care | Payer: BLUE CROSS/BLUE SHIELD | Attending: Emergency Medicine | Admitting: Emergency Medicine

## 2015-12-16 DIAGNOSIS — J40 Bronchitis, not specified as acute or chronic: Secondary | ICD-10-CM | POA: Diagnosis not present

## 2015-12-16 MED ORDER — ALBUTEROL SULFATE HFA 108 (90 BASE) MCG/ACT IN AERS
2.0000 | INHALATION_SPRAY | RESPIRATORY_TRACT | Status: DC
  Administered 2015-12-16: 2 via RESPIRATORY_TRACT

## 2015-12-16 MED ORDER — AZITHROMYCIN 250 MG PO TABS: 250.0000 mg | ORAL_TABLET | Freq: Every day | ORAL | 0 refills | Status: DC

## 2015-12-16 MED ORDER — ALBUTEROL SULFATE HFA 108 (90 BASE) MCG/ACT IN AERS
INHALATION_SPRAY | RESPIRATORY_TRACT | Status: AC
  Filled 2015-12-16: qty 6.7

## 2015-12-16 NOTE — ED Triage Notes (Signed)
PT reports a cough since Oct. 30. PT has tried to treat at home. PT reports cough has been productive. Yesterday PT developed nasal and facial congestion. PT reports sinus pain as well.

## 2015-12-16 NOTE — ED Provider Notes (Signed)
CSN: [id number]     Arrival date & time 12/16/15  1759 History   None    Chief Complaint  Patient presents with  . Cough   (Consider location/radiation/quality/duration/timing/severity/associated sxs/prior Treatment) The history is provided by the patient. No language interpreter was used.  Cough  Cough characteristics:  Productive Sputum characteristics:  Nondescript Severity:  Moderate Onset quality:  Gradual Duration:  3 weeks Timing:  Intermittent Progression:  Worsening Chronicity:  New Smoker: no   Relieved by:  Nothing Worsened by:  Nothing Ineffective treatments:  None tried Associated symptoms: sinus congestion   Associated symptoms: no chest pain     Past Medical History:  Diagnosis Date  . Asthma   . GERD (gastroesophageal reflux disease)   . Obesity    Past Surgical History:  Procedure Laterality Date  . TUBAL LIGATION     No family history on file. Social History  Substance Use Topics  . Smoking status: Former Smoker    Types: Cigarettes    Quit date: 11/26/2012  . Smokeless tobacco: Never Used  . Alcohol use No   OB History    No data available     Review of Systems  Respiratory: Positive for cough.   Cardiovascular: Negative for chest pain.  All other systems reviewed and are negative.   Allergies  Patient has no known allergies.  Home Medications   Prior to Admission medications   Medication Sig Start Date End Date Taking? Authorizing Provider  albuterol (PROVENTIL HFA;VENTOLIN HFA) 108 (90 BASE) MCG/ACT inhaler Inhale 2 puffs into the lungs every 4 (four) hours as needed for wheezing or shortness of breath. 08/20/13  Yes Geoffery Lyonsouglas Delo, MD  brompheniramine-pseudoephedrine-DM 30-2-10 MG/5ML syrup Take 10 mLs by mouth 4 (four) times daily as needed. 04/10/15  Yes Linna HoffJames D Kindl, MD  dextromethorphan-guaiFENesin [facility](MUCINEX DM) 30-600 MG per 12 hr tablet Take 1 tablet by mouth 2 (two) times daily as needed for cough.   Yes Historical Provider, MD   ipratropium (ATROVENT) 0.06 % nasal spray Place 2 sprays into both nostrils 4 (four) times daily. 04/10/15  Yes Linna HoffJames D Kindl, MD  omeprazole (PRILOSEC) 20 MG capsule Take 1 capsule (20 mg total) by mouth daily. 07/25/14  Yes Everlene FarrierWilliam Dansie, PA-C  azithromycin (ZITHROMAX) 250 MG tablet Take 1 tablet (250 mg total) by mouth daily. Take first 2 tablets together, then 1 every day until finished. 12/16/15   Elson AreasLeslie K Sofia, PA-C  benzonatate (TESSALON) 200 MG capsule Take 1 capsule (200 mg total) by mouth 3 (three) times daily as needed for cough. 07/25/14   Everlene FarrierWilliam Dansie, PA-C  cephALEXin (KEFLEX) 500 MG capsule Take 1 capsule (500 mg total) by mouth 4 (four) times daily. 03/21/14   Bethann BerkshireJoseph Zammit, MD  naproxen (NAPROSYN) 500 MG tablet Take 1 tablet (500 mg total) by mouth 2 (two) times daily. 11/01/14   Linna HoffJames D Kindl, MD  predniSONE (DELTASONE) 20 MG tablet Take 2 tablets (40 mg total) by mouth daily. 07/25/14   Everlene FarrierWilliam Dansie, PA-C  traMADol (ULTRAM) 50 MG tablet Take 1 tablet (50 mg total) by mouth every 6 (six) hours as needed. 03/21/14   Bethann BerkshireJoseph Zammit, MD   Meds Ordered and Administered this Visit   Medications  albuterol (PROVENTIL HFA;VENTOLIN HFA) 108 (90 Base) MCG/ACT inhaler 2 puff (2 puffs Inhalation Given 12/16/15 2101)    BP 158/81   Pulse 81   Temp 98.5 F (36.9 C) (Oral)   Resp 16   Ht 5\' 3"  (1.6 m)  Wt (!) 346 lb (156.9 kg)   SpO2 100%   BMI 61.29 kg/m  No data found.   Physical Exam  Constitutional: She is oriented to person, place, and time. She appears well-developed and well-nourished.  HENT:  Head: Normocephalic.  Right Ear: External ear normal.  Left Ear: External ear normal.  Mouth/Throat: Oropharynx is clear and moist.  Eyes: EOM are normal.  Neck: Normal range of motion.  Cardiovascular: Normal rate and regular rhythm.   Pulmonary/Chest: She has wheezes.  Faint wheezing, rhonchi  Abdominal: She exhibits no distension.  Musculoskeletal: Normal range of motion.   Neurological: She is alert and oriented to person, place, and time.  Psychiatric: She has a normal mood and affect.  Nursing note and vitals reviewed.   Urgent Care Course   Clinical Course     Procedures (including critical care time)  Labs Review Labs Reviewed - No data to display  Imaging Review No results found.   Visual Acuity Review  Right Eye Distance:   Left Eye Distance:   Bilateral Distance:    Right Eye Near:   Left Eye Near:    Bilateral Near:         MDM Pt given albuterol inhaler, 2 puffs here, Pt advised to continue every 4 hours. Rx for zithromax.     1. Bronchitis    An After Visit Summary was printed and given to the patient. Meds ordered this encounter  Medications  . albuterol (PROVENTIL HFA;VENTOLIN HFA) 108 (90 Base) MCG/ACT inhaler 2 puff  . azithromycin (ZITHROMAX) 250 MG tablet    Sig: Take 1 tablet (250 mg total) by mouth daily. Take first 2 tablets together, then 1 every day until finished.    Dispense:  6 tablet    Refill:  0    Order Specific Question:   Supervising Provider    Answer:   Domenick GongMORTENSON, ASHLEY [[id number]]     Elson AreasLeslie K Sofia, PA-C 12/16/15 2146

## 2016-03-12 ENCOUNTER — Encounter (HOSPITAL_COMMUNITY): Payer: Self-pay | Admitting: *Deleted

## 2016-03-12 ENCOUNTER — Ambulatory Visit (HOSPITAL_COMMUNITY)
Admission: EM | Admit: 2016-03-12 | Discharge: 2016-03-12 | Disposition: A | Discharge status: Home / Self Care | Payer: BLUE CROSS/BLUE SHIELD | Attending: Internal Medicine | Admitting: Internal Medicine

## 2016-03-12 DIAGNOSIS — J209 Acute bronchitis, unspecified: Secondary | ICD-10-CM | POA: Diagnosis not present

## 2016-03-12 DIAGNOSIS — J4521 Mild intermittent asthma with (acute) exacerbation: Secondary | ICD-10-CM

## 2016-03-12 MED ORDER — BENZONATATE 100 MG PO CAPS
100.0000 mg | ORAL_CAPSULE | Freq: Three times a day (TID) | ORAL | 0 refills | Status: DC
Start: 2016-03-12 — End: 2016-06-30

## 2016-03-12 MED ORDER — METHYLPREDNISOLONE ACETATE 80 MG/ML IJ SUSP
80.0000 mg | Freq: Once | INTRAMUSCULAR | Status: AC
  Administered 2016-03-12: 80 mg via INTRAMUSCULAR

## 2016-03-12 MED ORDER — METHYLPREDNISOLONE ACETATE 80 MG/ML IJ SUSP
INTRAMUSCULAR | Status: AC
  Filled 2016-03-12: qty 1

## 2016-03-12 MED ORDER — AZITHROMYCIN 250 MG PO TABS: 250.0000 mg | ORAL_TABLET | Freq: Every day | ORAL | 0 refills | Status: DC

## 2016-03-12 MED ORDER — PREDNISONE 20 MG PO TABS: 20.0000 mg | ORAL_TABLET | Freq: Two times a day (BID) | ORAL | 0 refills | Status: DC

## 2016-03-12 NOTE — ED Provider Notes (Signed)
CSN: [id number]     Arrival date & time 03/12/16  1728 History   First MD Initiated Contact with Patient 03/12/16 1822     Chief Complaint  Patient presents with  . Cough   (Consider location/radiation/quality/duration/timing/severity/associated sxs/prior Treatment) 46 year old female presents to clinic with one weeklong history of cough, she describes her cough as continuous, dry, hacking, nonproductive, with clear sputum. She does not smoke, however she does have history of asthma, and has a prescribed albuterol inhaler. She says she has been using her inhaler more than usual, stating she's used 3-4 times previous 24 hours. She reports her symptoms started about 7 days ago as typical URI like fever congestion, sore throat, states those symptoms have since resolved however her cough has remained.   The history is provided by the patient.  Cough    Past Medical History:  Diagnosis Date  . Asthma   . GERD (gastroesophageal reflux disease)   . Obesity    Past Surgical History:  Procedure Laterality Date  . TUBAL LIGATION     History reviewed. No pertinent family history. Social History  Substance Use Topics  . Smoking status: Former Smoker    Types: Cigarettes    Quit date: 11/26/2012  . Smokeless tobacco: Never Used  . Alcohol use No   OB History    No data available     Review of Systems  Reason unable to perform ROS: as covered in HPI.  Respiratory: Positive for cough.   All other systems reviewed and are negative.   Allergies  Patient has no known allergies.  Home Medications   Prior to Admission medications   Medication Sig Start Date End Date Taking? Authorizing Provider  albuterol (PROVENTIL HFA;VENTOLIN HFA) 108 (90 BASE) MCG/ACT inhaler Inhale 2 puffs into the lungs every 4 (four) hours as needed for wheezing or shortness of breath. 08/20/13   Geoffery Lyons, MD  azithromycin (ZITHROMAX) 250 MG tablet Take 1 tablet (250 mg total) by mouth daily. Take first 2  tablets together, then 1 every day until finished. 03/12/16   Dorena Bodo, NP  benzonatate (TESSALON) 100 MG capsule Take 1 capsule (100 mg total) by mouth every 8 (eight) hours. 03/12/16   Dorena Bodo, NP  brompheniramine-pseudoephedrine-DM 30-2-10 MG/5ML syrup Take 10 mLs by mouth 4 (four) times daily as needed. 04/10/15   Linna Hoff, MD  cephALEXin (KEFLEX) 500 MG capsule Take 1 capsule (500 mg total) by mouth 4 (four) times daily. 03/21/14   Bethann Berkshire, MD  dextromethorphan-guaiFENesin [facility] DM) 30-600 MG per 12 hr tablet Take 1 tablet by mouth 2 (two) times daily as needed for cough.    Historical Provider, MD  ipratropium (ATROVENT) 0.06 % nasal spray Place 2 sprays into both nostrils 4 (four) times daily. 04/10/15   Linna Hoff, MD  naproxen (NAPROSYN) 500 MG tablet Take 1 tablet (500 mg total) by mouth 2 (two) times daily. 11/01/14   Linna Hoff, MD  omeprazole (PRILOSEC) 20 MG capsule Take 1 capsule (20 mg total) by mouth daily. 07/25/14   [name], PA-C  predniSONE (DELTASONE) 20 MG tablet Take 1 tablet (20 mg total) by mouth 2 (two) times daily with a meal. 03/12/16   Dorena Bodo, NP  traMADol (ULTRAM) 50 MG tablet Take 1 tablet (50 mg total) by mouth every 6 (six) hours as needed. 03/21/14   Bethann Berkshire, MD   Meds Ordered and Administered this Visit   Medications  methylPREDNISolone acetate (DEPO-MEDROL) injection 80 mg (  80 mg Intramuscular Given 03/12/16 1857)    BP 135/59 (BP Location: Right Arm)   Pulse 77   Temp 98.6 F (37 C) (Oral)   Resp 19   SpO2 97%  No data found.   Physical Exam  Constitutional: She is oriented to person, place, and time. She appears well-developed and well-nourished. She appears ill. No distress.  HENT:  Head: Normocephalic and atraumatic.  Right Ear: Tympanic membrane and external ear normal.  Left Ear: Tympanic membrane and external ear normal.  Nose: Nose normal. Right sinus exhibits no maxillary sinus tenderness  and no frontal sinus tenderness. Left sinus exhibits no maxillary sinus tenderness and no frontal sinus tenderness.  Mouth/Throat: Uvula is midline and oropharynx is clear and moist. No oropharyngeal exudate.  Eyes: Pupils are equal, round, and reactive to light.  Neck: Normal range of motion. Neck supple. No JVD present.  Cardiovascular: Normal rate and regular rhythm.   Pulmonary/Chest: Effort normal. No respiratory distress. She has wheezes (difuse inspiratory wheezing heard throughout all fields.).  Abdominal: Soft. Bowel sounds are normal. She exhibits no distension. There is no tenderness. There is no guarding.  Lymphadenopathy:       Head (right side): No submental, no submandibular and no tonsillar adenopathy present.       Head (left side): No submental, no submandibular and no tonsillar adenopathy present.    She has no cervical adenopathy.  Neurological: She is alert and oriented to person, place, and time.  Skin: Skin is warm and dry. Capillary refill takes less than 2 seconds. She is not diaphoretic.  Psychiatric: She has a normal mood and affect.  Nursing note and vitals reviewed.   Urgent Care Course     Procedures (including critical care time)  Labs Review Labs Reviewed - No data to display  Imaging Review No results found.   Visual Acuity Review  Right Eye Distance:   Left Eye Distance:   Bilateral Distance:    Right Eye Near:   Left Eye Near:    Bilateral Near:         MDM   1. Mild intermittent asthma with exacerbation   2. Acute bronchitis, unspecified organism    I am treating you tonight for bronchitis and for and asthma exacerbation. I have started you on azithromycin, take 2 tablets tonight then 1 daily until finished. For cough I have prescribed Tessalon, take one tablet every 8 hours as needed. For your wheezing I have prescribed a steroid, prednisone. Take one tablet twice a day for 6 days. Continue to use your albuterol inhaler every 4-6  hours as needed for wheezing and shortness of breath. Continue to take your Mucinex twice a day with a full glass of water. Should your symptoms persist or fail to improve follow up with your primary care provider, or return to clinic as needed.     Dorena BodoLawrence Lajuanda Penick, NP 03/12/16 [phone]

## 2016-03-12 NOTE — ED Triage Notes (Signed)
Patient reports she has had a cough nonproductive (states it feels like there is something that needs to come up but it wont), with now sore throat and headache. States thinks she has had fever but none today.

## 2016-03-12 NOTE — Discharge Instructions (Signed)
I am treating you tonight for bronchitis and for and asthma exacerbation. I have started you on azithromycin, take 2 tablets tonight then 1 daily until finished. For cough I have prescribed Tessalon, take one tablet every 8 hours as needed. For your wheezing I have prescribed a steroid, prednisone. Take one tablet twice a day for 6 days. Continue to use your albuterol inhaler every 4-6 hours as needed for wheezing and shortness of breath. Continue to take your Mucinex twice a day with a full glass of water. Should your symptoms persist or fail to improve follow up with your primary care provider, or return to clinic as needed.

## 2016-06-28 IMAGING — CR DG CHEST 2V
2 series · 2 of 2 positions shown · non-contrast
Comparison: None.

CLINICAL DATA: Cough, congestion and wheezing.

EXAM:
CHEST  2 VIEW

[w chest pa]
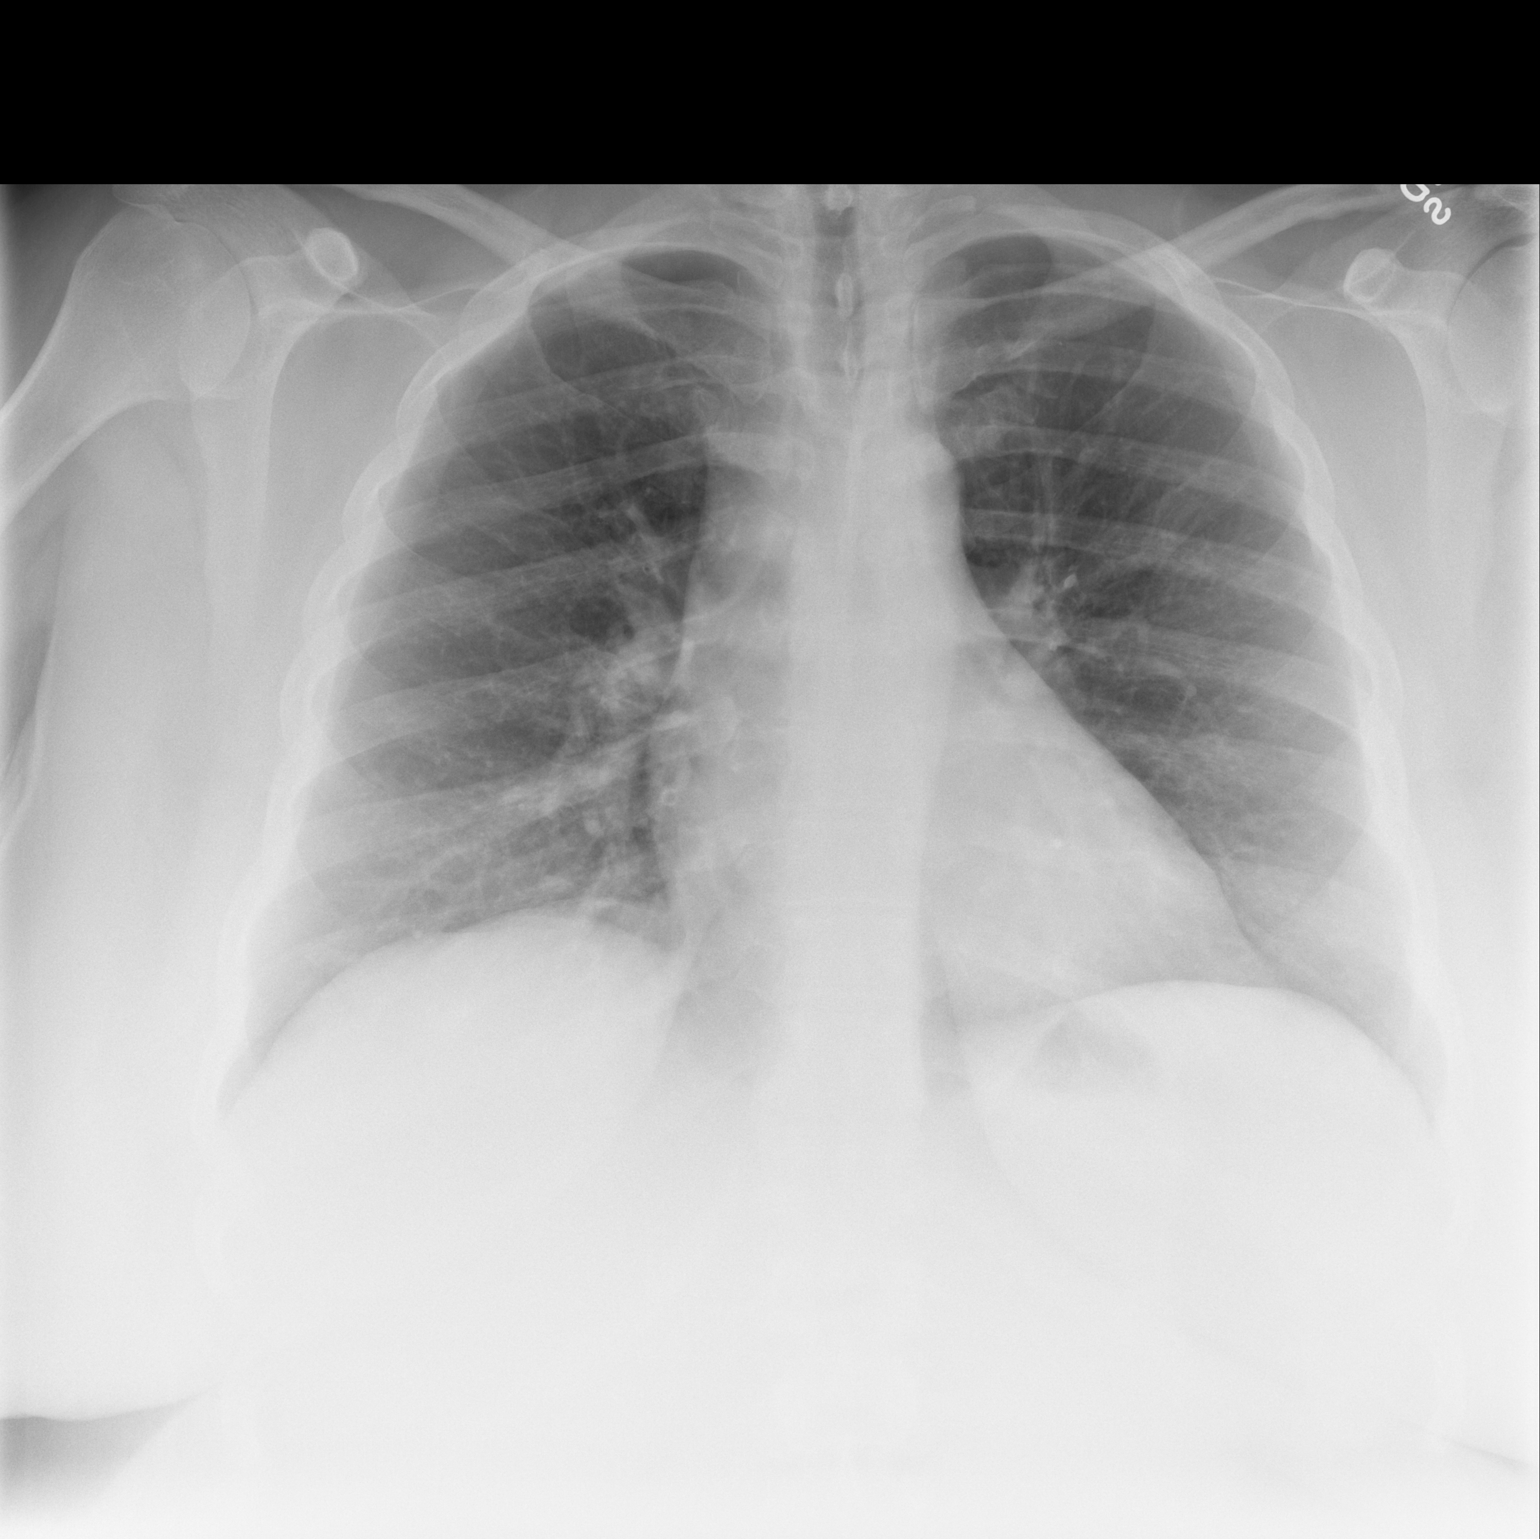

[w chest lat *]
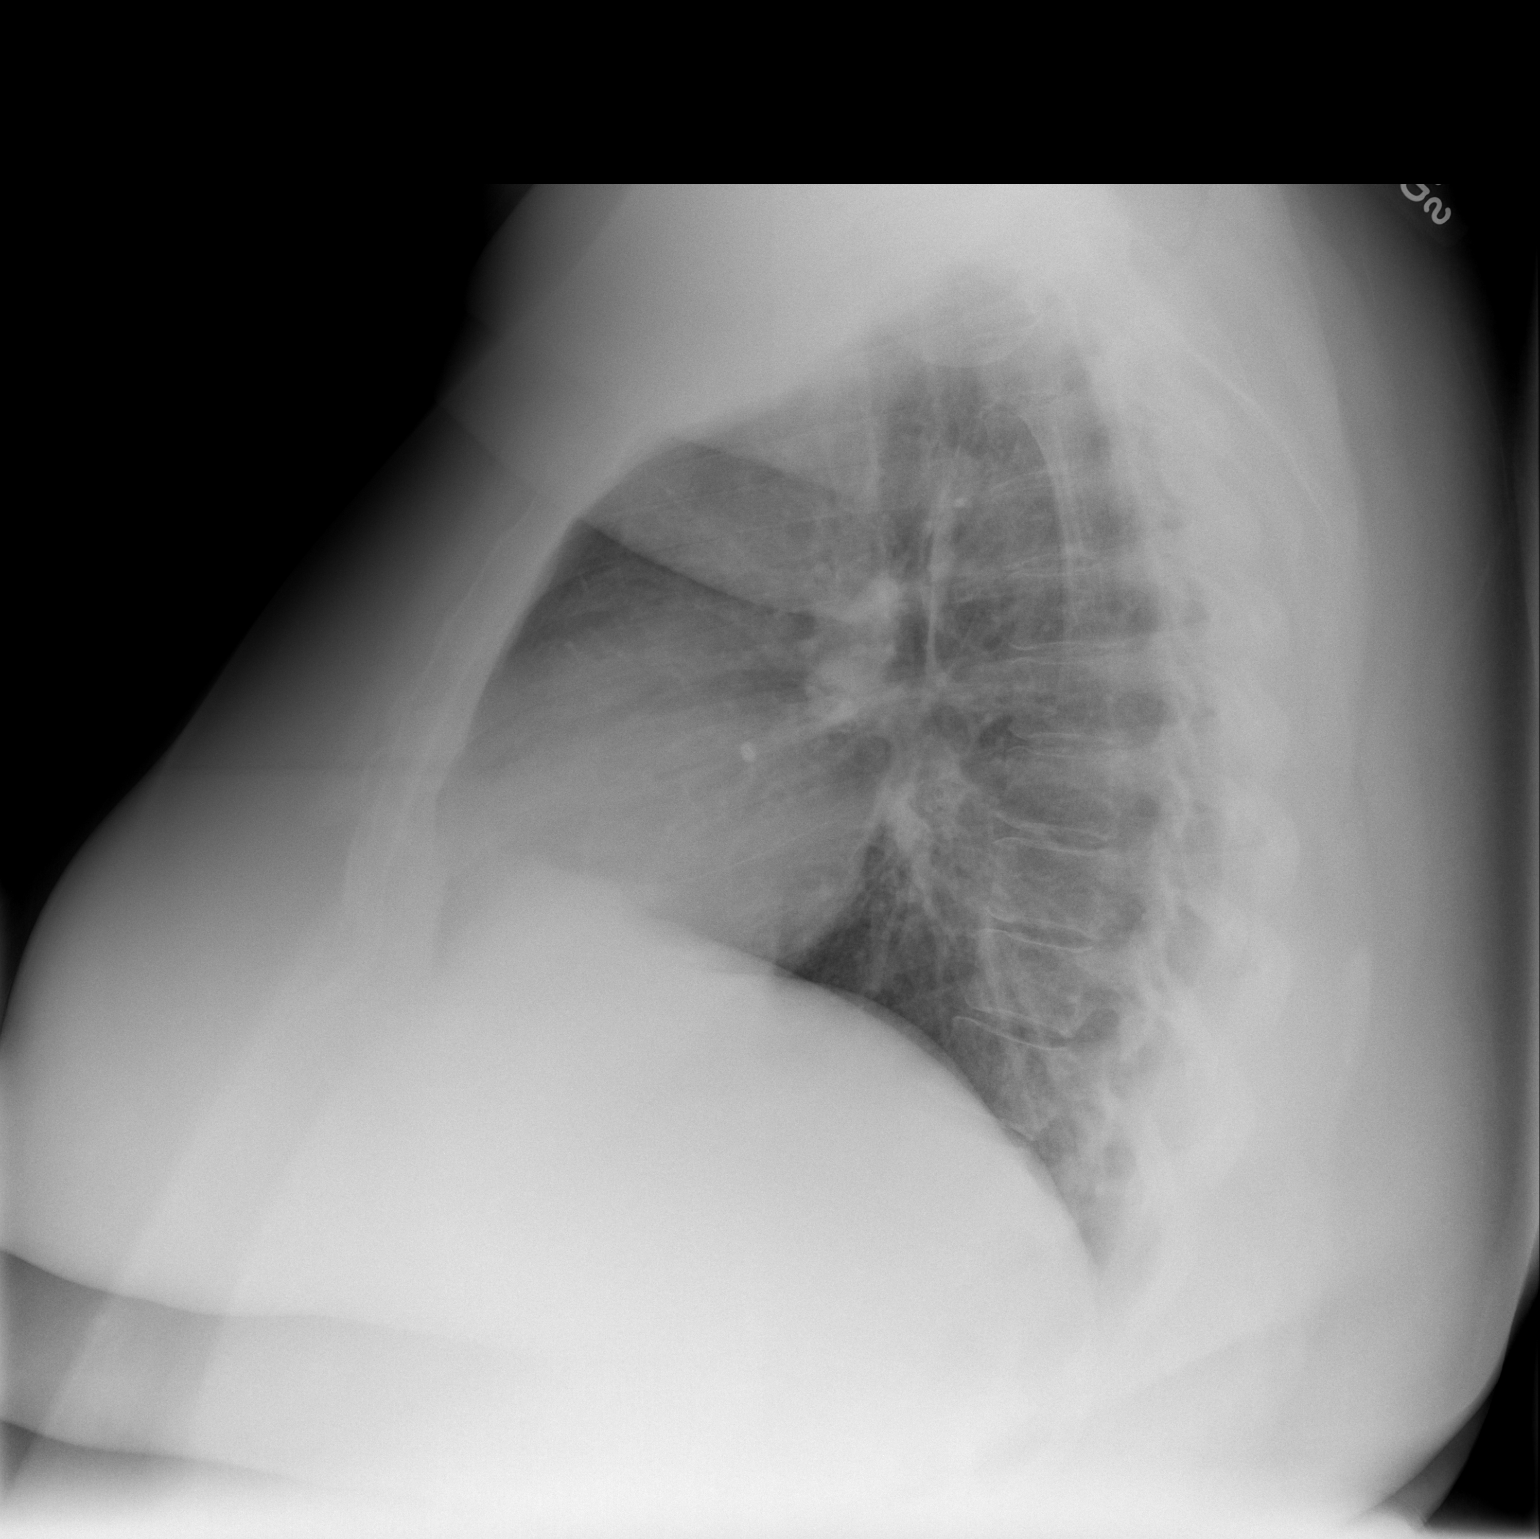

[2 of 2 positions shown; findings below may reference images not displayed]

FINDINGS: The cardiac silhouette, mediastinal and hilar contours are normal.
The lungs are clear. No pleural effusion. The bony thorax is intact.
IMPRESSION: No acute cardiopulmonary findings.

## 2016-06-30 ENCOUNTER — Ambulatory Visit (HOSPITAL_COMMUNITY)
Admission: EM | Admit: 2016-06-30 | Discharge: 2016-06-30 | Disposition: A | Discharge status: Home / Self Care | Payer: BLUE CROSS/BLUE SHIELD | Attending: Internal Medicine | Admitting: Internal Medicine

## 2016-06-30 ENCOUNTER — Encounter (HOSPITAL_COMMUNITY): Payer: Self-pay | Admitting: Family Medicine

## 2016-06-30 DIAGNOSIS — M79604 Pain in right leg: Secondary | ICD-10-CM

## 2016-06-30 MED ORDER — PREDNISONE 10 MG PO TABS: ORAL_TABLET | ORAL | 0 refills | Status: DC

## 2016-06-30 MED ORDER — HYDROCODONE-ACETAMINOPHEN 5-325 MG PO TABS: 2.0000 | ORAL_TABLET | ORAL | 0 refills | Status: DC | PRN

## 2016-06-30 NOTE — ED Triage Notes (Signed)
Pt here for RLE pain for 3 months. sts today at work it gave out on her. Denies injury. sts sometimes it falls asleep. sts some swelling. sts sometimes sharp pain in buttocks area. Denies back pain.

## 2016-07-01 ENCOUNTER — Other Ambulatory Visit: Payer: Self-pay | Admitting: Sports Medicine

## 2016-07-01 DIAGNOSIS — M5440 Lumbago with sciatica, unspecified side: Secondary | ICD-10-CM

## 2016-07-01 NOTE — ED Provider Notes (Signed)
[facility] Provider Note   CSN: [id number] Arrival date & time: 06/30/16  1606     History   Chief Complaint Chief Complaint  Patient presents with  . Leg Pain    HPI Wendy Bradley is a 46 y.o. female.  The history is provided by the patient. No language interpreter was used.  Leg Pain   This is a new problem. Episode onset: 3 months. The problem occurs constantly. The problem has been gradually worsening. The pain is present in the right knee. The quality of the pain is described as aching. The pain is moderate. Associated symptoms include limited range of motion. She has tried nothing for the symptoms. The treatment provided no relief. There has been no history of extremity trauma.  Pt reports she has been having knee problems. Pt reports today her knee gave away.  Pt reports she can not carry her weight.   Past Medical History:  Diagnosis Date  . Asthma   . GERD (gastroesophageal reflux disease)   . Obesity     There are no active problems to display for this patient.   Past Surgical History:  Procedure Laterality Date  . TUBAL LIGATION      OB History    No data available       Home Medications    Prior to Admission medications   Medication Sig Start Date End Date Taking? Authorizing Provider  albuterol (PROVENTIL HFA;VENTOLIN HFA) 108 (90 BASE) MCG/ACT inhaler Inhale 2 puffs into the lungs every 4 (four) hours as needed for wheezing or shortness of breath. 08/20/13   Geoffery Lyons, MD  HYDROcodone-acetaminophen (NORCO/VICODIN) 5-325 MG tablet Take 2 tablets by mouth every 4 (four) hours as needed. 06/30/16   Elson Areas, PA-C  omeprazole (PRILOSEC) 20 MG capsule Take 1 capsule (20 mg total) by mouth daily. 07/25/14   [name], PA-C  predniSONE (DELTASONE) 10 MG tablet 6,5,4,3,2,1 taper 06/30/16   Elson Areas, PA-C    Family History History reviewed. No pertinent family history.  Social History Social History  Substance Use Topics  .  Smoking status: Former Smoker    Types: Cigarettes    Quit date: 11/26/2012  . Smokeless tobacco: Never Used  . Alcohol use No     Allergies   Patient has no known allergies.   Review of Systems Review of Systems  All other systems reviewed and are negative.    Physical Exam Updated Vital Signs BP 114/62   Pulse 95   Resp 18   SpO2 100%   Physical Exam  Constitutional: She appears well-developed and well-nourished. No distress.  HENT:  Head: Normocephalic and atraumatic.  Eyes: Conjunctivae are normal.  Neck: Neck supple.  Cardiovascular: Normal rate and regular rhythm.   No murmur heard. Pulmonary/Chest: Effort normal and breath sounds normal. No respiratory distress.  Abdominal: Soft. There is no tenderness.  Musculoskeletal: She exhibits no edema.  Neurological: She is alert.  Skin: Skin is warm and dry.  Psychiatric: She has a normal mood and affect.  Nursing note and vitals reviewed.    ED Treatments / Results  Labs (all labs ordered are listed, but only abnormal results are displayed) Labs Reviewed - No data to display  EKG  EKG Interpretation None       Radiology No results found.  Procedures Procedures (including critical care time)  Medications Ordered in ED Medications - No data to display   Initial Impression / Assessment and Plan / ED Course  I have reviewed the triage vital signs and the nursing notes.  Pertinent labs & imaging results that were available during my care of the patient were reviewed by me and considered in my medical decision making (see chart for details).     Pt given crutches,  I advised her to schedule to see the Orthopaedist for evaluation  Final Clinical Impressions(s) / ED Diagnoses   Final diagnoses:  Right leg pain    New Prescriptions Discharge Medication List as of 06/30/2016  5:39 PM    START taking these medications   Details  HYDROcodone-acetaminophen (NORCO/VICODIN) 5-325 MG tablet Take 2  tablets by mouth every 4 (four) hours as needed., Starting Tue 06/30/2016, Print    predniSONE (DELTASONE) 10 MG tablet 6,5,4,3,2,1 taper, Normal      An After Visit Summary was printed and given to the patient.    Elson AreasSofia, Leslie K, [place]C 07/01/16 1307

## 2016-07-03 ENCOUNTER — Other Ambulatory Visit: Payer: Self-pay | Admitting: Sports Medicine

## 2016-07-03 DIAGNOSIS — M545 Low back pain: Secondary | ICD-10-CM

## 2016-07-15 ENCOUNTER — Ambulatory Visit
Admission: RE | Admit: 2016-07-15 | Discharge: 2016-07-15 | Disposition: A | Discharge status: Home / Self Care | Payer: BLUE CROSS/BLUE SHIELD | Source: Ambulatory Visit | Attending: Sports Medicine | Admitting: Sports Medicine

## 2016-07-15 DIAGNOSIS — M5440 Lumbago with sciatica, unspecified side: Secondary | ICD-10-CM

## 2017-02-12 ENCOUNTER — Encounter (HOSPITAL_COMMUNITY): Payer: Self-pay | Admitting: Emergency Medicine

## 2017-02-12 ENCOUNTER — Other Ambulatory Visit: Payer: Self-pay

## 2017-02-12 ENCOUNTER — Ambulatory Visit (HOSPITAL_COMMUNITY)
Admission: EM | Admit: 2017-02-12 | Discharge: 2017-02-12 | Disposition: A | Discharge status: Home / Self Care | Payer: BLUE CROSS/BLUE SHIELD | Attending: Emergency Medicine | Admitting: Emergency Medicine

## 2017-02-12 DIAGNOSIS — G8929 Other chronic pain: Secondary | ICD-10-CM | POA: Diagnosis not present

## 2017-02-12 DIAGNOSIS — M25562 Pain in left knee: Secondary | ICD-10-CM

## 2017-02-12 MED ORDER — DICLOFENAC SODIUM 75 MG PO TBEC: 75.0000 mg | DELAYED_RELEASE_TABLET | Freq: Two times a day (BID) | ORAL | 0 refills | Status: AC

## 2017-02-12 MED ORDER — PREDNISONE 20 MG PO TABS: 40.0000 mg | ORAL_TABLET | Freq: Every day | ORAL | 0 refills | Status: AC

## 2017-02-12 NOTE — ED Triage Notes (Signed)
Pt c/o L knee pain since the middle of the year, states she isnt sure if its arthritis. Taking tylenol and naproxen prescribed by the orthopedic doctor without relief. Pt states sitting too long it gets stiff.

## 2017-02-12 NOTE — Discharge Instructions (Signed)
Please follow up with orthopedics if you have had relief with injections in the past.   Please take prednisone x 4 days and diclofenac twice daily for 10 days.   Over the counter anti-inflammatories may be used as an alternative to diclofenac for pain/swelling. You may take up to 800 mg Ibuprofen every 8 hours with food. You may supplement Ibuprofen with Tylenol [phone] mg every 8 hours.   Please alternate using ice/heating pad to help with any discomfort.   Please follow up here or with your primary care if symptoms not improving in 2 weeks. Please return sooner if pain changes, worsens, develop numbness, tingling.

## 2017-02-12 NOTE — ED Provider Notes (Signed)
[facility]    CSN: [id number] Arrival date & time: 02/12/17  1558     History   Chief Complaint Chief Complaint  Patient presents with  . Knee Pain    HPI Wendy Bradley is a 47 y.o. female presenting with chronic left knee pain. Left knee pain began hurting after she favored that pain due to right knee pain. Pain was managed with Tylenol, ibuprofen, essential oils, cream and elevation. Symptoms manageable and knee pain improving until late November when she rode in a car to and from [place]. Knee became stiff and pain worsening. Endorses limping, has used a cane to help walk. Denies fevers. Pain is constant, with sitting and standing/walking.  HPI  Past Medical History:  Diagnosis Date  . Asthma   . GERD (gastroesophageal reflux disease)   . Obesity     There are no active problems to display for this patient.   Past Surgical History:  Procedure Laterality Date  . TUBAL LIGATION      OB History    No data available       Home Medications    Prior to Admission medications   Medication Sig Start Date End Date Taking? Authorizing Provider  NAPROXEN PO Take by mouth.   Yes [provider]  albuterol (PROVENTIL HFA;VENTOLIN HFA) 108 (90 BASE) MCG/ACT inhaler Inhale 2 puffs into the lungs every 4 (four) hours as needed for wheezing or shortness of breath. 08/20/13   Geoffery Lyons, MD  diclofenac (VOLTAREN) 75 MG EC tablet Take 1 tablet (75 mg total) by mouth 2 (two) times daily for 10 days. 02/12/17 02/22/17  Isha Seefeld C, PA-C  HYDROcodone-acetaminophen (NORCO/VICODIN) 5-325 MG tablet Take 2 tablets by mouth every 4 (four) hours as needed. 06/30/16   Elson Areas, PA-C  omeprazole (PRILOSEC) 20 MG capsule Take 1 capsule (20 mg total) by mouth daily. 07/25/14   [name], PA-C  predniSONE (DELTASONE) 20 MG tablet Take 2 tablets (40 mg total) by mouth daily for 4 days. 02/12/17 02/16/17  Alaria Oconnor, Junius Creamer, PA-C    Family History No family  history on file.  Social History Social History   Tobacco Use  . Smoking status: Former Smoker    Types: Cigarettes    Last attempt to quit: 11/26/2012    Years since quitting: 4.2  . Smokeless tobacco: Never Used  Substance Use Topics  . Alcohol use: No  . Drug use: No     Allergies   Patient has no known allergies.   Review of Systems Review of Systems  Constitutional: Negative for fever.  Respiratory: Negative for shortness of breath.   Cardiovascular: Negative for chest pain.  Gastrointestinal: Negative for nausea and vomiting.  Musculoskeletal: Positive for arthralgias.       Knee pain  Skin: Negative for color change.  Neurological: Negative for dizziness, weakness, light-headedness and headaches.     Physical Exam Triage Vital Signs ED Triage Vitals  Enc Vitals Group     BP 02/12/17 1621 (!) 171/83     Pulse Rate 02/12/17 1621 (!) 103     Resp 02/12/17 1621 18     Temp 02/12/17 1621 98.4 F (36.9 C)     Temp src --      SpO2 02/12/17 1621 100 %     Weight --      Height --      Head Circumference --      Peak Flow --  Pain Score 02/12/17 1622 8     Pain Loc --      Pain Edu? --      Excl. in GC? --    No data found.  Updated Vital Signs BP (!) 171/83   Pulse (!) 103   Temp 98.4 F (36.9 C)   Resp 18   LMP  (LMP Unknown)   SpO2 100%   Visual Acuity Right Eye Distance:   Left Eye Distance:   Bilateral Distance:    Right Eye Near:   Left Eye Near:    Bilateral Near:     Physical Exam  Constitutional: She appears well-developed and well-nourished. No distress.  Patient obese  HENT:  Head: Normocephalic and atraumatic.  Eyes: Conjunctivae are normal.  Neck: Neck supple.  Cardiovascular: Normal rate.  Pulmonary/Chest: Effort normal. No respiratory distress.  Musculoskeletal: She exhibits no edema.  Knee appears large, but seems secondary to obesity not swelling. Tenderness to palpation just superior and inferior to patella. Pain  at medial joint line with standing, lateral joint line with sitting. Knee non-erythematous. Full ROM.   Neurological: She is alert.  Skin: Skin is warm and dry.  Psychiatric: She has a normal mood and affect.  Nursing note and vitals reviewed.    UC Treatments / Results  Labs (all labs ordered are listed, but only abnormal results are displayed) Labs Reviewed - No data to display  EKG  EKG Interpretation None       Radiology No results found.  Procedures Procedures (including critical care time)  Medications Ordered in UC Medications - No data to display   Initial Impression / Assessment and Plan / UC Course  I have reviewed the triage vital signs and the nursing notes.  Pertinent labs & imaging results that were available during my care of the patient were reviewed by me and considered in my medical decision making (see chart for details).     Pain likely related to obesity and arthritis. States she has found benefit with prednisone in the past. Had injections on right knee; advised to follow up with ortho if injections provided relief in past. Joint does not appear septic, full ROM. Prednisone and will try alternative NSAID-diclofenac. Discussed strict return precautions. Patient verbalized understanding and is agreeable with plan.   Final Clinical Impressions(s) / UC Diagnoses   Final diagnoses:  Chronic pain of left knee    ED Discharge Orders        Ordered    predniSONE (DELTASONE) 20 MG tablet  Daily     02/12/17 1718    diclofenac (VOLTAREN) 75 MG EC tablet  2 times daily     02/12/17 1718      Controlled Substance Prescriptions [place] Controlled Substance Registry consulted? Not Applicable   Lew DawesWieters, Mertis Mosher C, [place]-C 02/12/17 1731

## 2017-02-22 ENCOUNTER — Ambulatory Visit (HOSPITAL_COMMUNITY)
Admission: EM | Admit: 2017-02-22 | Discharge: 2017-02-22 | Disposition: A | Discharge status: Home / Self Care | Payer: BLUE CROSS/BLUE SHIELD | Attending: Family Medicine | Admitting: Family Medicine

## 2017-02-22 ENCOUNTER — Encounter (HOSPITAL_COMMUNITY): Payer: Self-pay

## 2017-02-22 ENCOUNTER — Other Ambulatory Visit: Payer: Self-pay

## 2017-02-22 DIAGNOSIS — J111 Influenza due to unidentified influenza virus with other respiratory manifestations: Secondary | ICD-10-CM

## 2017-02-22 DIAGNOSIS — R69 Illness, unspecified: Secondary | ICD-10-CM

## 2017-02-22 MED ORDER — ONDANSETRON HCL 4 MG PO TABS: 4.0000 mg | ORAL_TABLET | Freq: Four times a day (QID) | ORAL | 0 refills | Status: AC

## 2017-02-22 MED ORDER — BENZONATATE 100 MG PO CAPS: 200.0000 mg | ORAL_CAPSULE | Freq: Three times a day (TID) | ORAL | 0 refills | Status: AC

## 2017-02-22 MED ORDER — HYDROCODONE-HOMATROPINE 5-1.5 MG/5ML PO SYRP: 5.0000 mL | ORAL_SOLUTION | Freq: Four times a day (QID) | ORAL | 0 refills | Status: AC | PRN

## 2017-02-22 MED ORDER — ALBUTEROL SULFATE HFA 108 (90 BASE) MCG/ACT IN AERS: 2.0000 | INHALATION_SPRAY | RESPIRATORY_TRACT | 0 refills | Status: DC | PRN

## 2017-02-22 NOTE — ED Triage Notes (Signed)
[facility], pt has taken Mucinex DM but has no relief

## 2017-02-22 NOTE — ED Notes (Signed)
Patient discharged by provider.

## 2017-02-22 NOTE — ED Provider Notes (Signed)
[facility]    CSN: [id number] Arrival date & time: 02/22/17  1623     History   Chief Complaint Chief Complaint  Patient presents with  . Nasal Congestion    HPI Wendy Bradley is a 47 y.o. female Patient is presenting with URI symptoms- congestion, cough, sore throat.  Also with fever, body aches, nausea.  Patient was laid up in bed on the second day due weakness. patient's main complaints are feeling poor, low energy. Symptoms have been going on for 4 days. Patient has tried Tylenol, ibuprofen, Mucinex DM, Vicks, with minimal relief. Denies vomiting, diarrhea. Denies shortness of breath and chest pain.  History of asthma.  Previous smoker.    HPI  Past Medical History:  Diagnosis Date  . Asthma   . GERD (gastroesophageal reflux disease)   . Obesity     There are no active problems to display for this patient.   Past Surgical History:  Procedure Laterality Date  . TUBAL LIGATION      OB History    No data available       Home Medications    Prior to Admission medications   Medication Sig Start Date End Date Taking? Authorizing Provider  omeprazole (PRILOSEC) 20 MG capsule Take 1 capsule (20 mg total) by mouth daily. 07/25/14  Yes Everlene Farrieransie, William, PA-C  albuterol (PROVENTIL HFA;VENTOLIN HFA) 108 (90 BASE) MCG/ACT inhaler Inhale 2 puffs into the lungs every 4 (four) hours as needed for wheezing or shortness of breath. 08/20/13   Geoffery Lyonselo, Douglas, MD  benzonatate (TESSALON) 100 MG capsule Take 2 capsules (200 mg total) by mouth every 8 (eight) hours for 5 days. 02/22/17 02/27/17  Jecenia Leamer C, PA-C  diclofenac (VOLTAREN) 75 MG EC tablet Take 1 tablet (75 mg total) by mouth 2 (two) times daily for 10 days. 02/12/17 02/22/17  Miles Leyda C, PA-C  HYDROcodone-acetaminophen (NORCO/VICODIN) 5-325 MG tablet Take 2 tablets by mouth every 4 (four) hours as needed. 06/30/16   Elson AreasSofia, Leslie K, PA-C  HYDROcodone-homatropine (HYCODAN) 5-1.5 MG/5ML syrup Take 5 mLs by  mouth every 6 (six) hours as needed for up to 5 days for cough. Use at bedtime 02/22/17 02/27/17  Elexia Friedt C, PA-C  NAPROXEN PO Take by mouth.    [provider]  ondansetron (ZOFRAN) 4 MG tablet Take 1 tablet (4 mg total) by mouth every 6 (six) hours for 5 days. 02/22/17 02/27/17  Lilienne Weins, Junius CreamerHallie C, PA-C    Family History History reviewed. No pertinent family history.  Social History Social History   Tobacco Use  . Smoking status: Former Smoker    Types: Cigarettes    Last attempt to quit: 11/26/2012    Years since quitting: 4.2  . Smokeless tobacco: Never Used  Substance Use Topics  . Alcohol use: No  . Drug use: No     Allergies   Patient has no known allergies.   Review of Systems Review of Systems  Constitutional: Negative for chills, fatigue and fever.  HENT: Positive for congestion, ear pain, rhinorrhea, sinus pressure and sore throat. Negative for trouble swallowing.   Respiratory: Positive for cough. Negative for chest tightness and shortness of breath.   Cardiovascular: Negative for chest pain.  Gastrointestinal: Positive for nausea. Negative for abdominal pain and vomiting.  Musculoskeletal: Positive for myalgias.  Skin: Negative for rash.  Neurological: Positive for weakness. Negative for dizziness, light-headedness and headaches.     Physical Exam Triage Vital Signs ED Triage Vitals  Enc  Vitals Group     BP 02/22/17 1754 135/82     Pulse Rate 02/22/17 1754 (!) 101     Resp 02/22/17 1754 18     Temp 02/22/17 1754 (!) 100.5 F (38.1 C)     Temp Source 02/22/17 1754 Oral     SpO2 02/22/17 1754 95 %     Weight --      Height --      Head Circumference --      Peak Flow --      Pain Score 02/22/17 1756 0     Pain Loc --      Pain Edu? --      Excl. in GC? --    No data found.  Updated Vital Signs BP 135/82 (BP Location: Left Arm)   Pulse (!) 101   Temp (!) 100.5 F (38.1 C) (Oral)   Resp 18   LMP  (LMP Unknown)   SpO2 94%    O2  saturation rechecked and was 97%.  Physical Exam  Constitutional: She appears well-developed and well-nourished. No distress.  HENT:  Head: Normocephalic and atraumatic.  Right Ear: Tympanic membrane and ear canal normal.  Left Ear: Tympanic membrane and ear canal normal.  Nose: Rhinorrhea present.  Mouth/Throat: Uvula is midline and mucous membranes are normal. No oral lesions. No trismus in the jaw. No uvula swelling. Posterior oropharyngeal erythema present.  Erythematous turbinates  Eyes: Conjunctivae are normal.  Neck: Neck supple.  Cardiovascular: Regular rhythm.  No murmur heard. Tachycardia  Pulmonary/Chest: Effort normal and breath sounds normal. No respiratory distress. She has no wheezes. She has no rales.  Breathing comfortably at rest, clear to auscultation bilaterally  Abdominal: Soft. There is no tenderness.  Musculoskeletal: She exhibits no edema.  Neurological: She is alert.  Skin: Skin is warm and dry.  Psychiatric: She has a normal mood and affect.  Nursing note and vitals reviewed.    UC Treatments / Results  Labs (all labs ordered are listed, but only abnormal results are displayed) Labs Reviewed - No data to display  EKG  EKG Interpretation None       Radiology No results found.  Procedures Procedures (including critical care time)  Medications Ordered in UC Medications - No data to display   Initial Impression / Assessment and Plan / UC Course  I have reviewed the triage vital signs and the nursing notes.  Pertinent labs & imaging results that were available during my care of the patient were reviewed by me and considered in my medical decision making (see chart for details).     Patient presents with symptoms likely from influenza versus other viral upper respiratory infection.  O2 97 on recheck we will defer chest x-ray for now.  Do not suspect underlying cardiopulmonary process. Symptoms seem unlikely related to ACS, CHF or COPD  exacerbations, pneumonia, pneumothorax. Patient is nontoxic appearing and not in need of emergent medical intervention.  Outside of Tamiflu window, continue supportive and symptomatic treatment.  Provided refill on albuterol inhaler.  Tessalon for cough, Hycodan at night as she states cough is worse at night.  For congestion advised to continue Mucinex or may try Sudafed or Flonase.  Control fever, stay hydrated.  Return if symptoms fail to improve in 1-2 weeks or you develop shortness of breath, chest pain, severe headache. Patient states understanding and is agreeable.   Final Clinical Impressions(s) / UC Diagnoses   Final diagnoses:  Influenza-like illness    ED Discharge  Orders        Ordered    ondansetron (ZOFRAN) 4 MG tablet  Every 6 hours     02/22/17 1822    benzonatate (TESSALON) 100 MG capsule  Every 8 hours     02/22/17 1825    HYDROcodone-homatropine (HYCODAN) 5-1.5 MG/5ML syrup  Every 6 hours PRN     02/22/17 1825       Controlled Substance Prescriptions [employer] Controlled Substance Registry consulted? Yes, I have consulted the [place] Controlled Substances Registry for this patient, and feel the risk/benefit ratio today is favorable for proceeding with this prescription for a controlled substance.  Prescription listed in the last year.   Lew Dawes, [place] 02/22/17 [phone]

## 2017-02-22 NOTE — Discharge Instructions (Signed)
Your symptoms seem to be the flu. I expect gradual improvement over the next week.  For fever please alternate Tylenol and ibuprofen every 4 hours, take with food on stomach.   I have sent in Zofran to help with nausea in order to encourage you to take in more food and liquids.  Began with blander foods like soup, rice, liquids, toast, crackers.  Then move to your normal diet as food sits well.   For your cough symptoms and Tessalon for use during the day and Hycodan to use at night.  Hycodan will cause sedation so only use in the evening to not drive after use.  For congestion you may continue Mucinex or you may try over-the-counter Sudafed or Flonase.  HONEY TEA: For sore throat and cough try using a honey-based tea. Use 3 teaspoons of honey with juice squeezed from half lemon. Place shaved pieces of ginger into 1/2-1 cup of water and warm over stove top. Then mix the ingredients and repeat every 4 hours as needed.

## 2017-12-06 ENCOUNTER — Ambulatory Visit (INDEPENDENT_AMBULATORY_CARE_PROVIDER_SITE_OTHER): Payer: BLUE CROSS/BLUE SHIELD

## 2017-12-06 ENCOUNTER — Other Ambulatory Visit: Payer: Self-pay

## 2017-12-06 ENCOUNTER — Ambulatory Visit (HOSPITAL_COMMUNITY)
Admission: EM | Admit: 2017-12-06 | Discharge: 2017-12-06 | Disposition: A | Discharge status: Home / Self Care | Payer: BLUE CROSS/BLUE SHIELD | Attending: Urgent Care | Admitting: Urgent Care

## 2017-12-06 ENCOUNTER — Encounter (HOSPITAL_COMMUNITY): Payer: Self-pay | Admitting: Emergency Medicine

## 2017-12-06 DIAGNOSIS — J3089 Other allergic rhinitis: Secondary | ICD-10-CM | POA: Diagnosis not present

## 2017-12-06 DIAGNOSIS — M7732 Calcaneal spur, left foot: Secondary | ICD-10-CM

## 2017-12-06 MED ORDER — BENZONATATE 100 MG PO CAPS: 100.0000 mg | ORAL_CAPSULE | Freq: Three times a day (TID) | ORAL | 0 refills | Status: AC | PRN

## 2017-12-06 MED ORDER — PROMETHAZINE-DM 6.25-15 MG/5ML PO SYRP
5.0000 mL | ORAL_SOLUTION | Freq: Every evening | ORAL | 0 refills | Status: AC | PRN
Start: 2017-12-06 — End: ?

## 2017-12-06 MED ORDER — PREDNISONE 20 MG PO TABS: ORAL_TABLET | ORAL | 0 refills | Status: DC

## 2017-12-06 MED ORDER — MELOXICAM 7.5 MG PO TABS: 15.0000 mg | ORAL_TABLET | Freq: Every day | ORAL | 0 refills | Status: AC

## 2017-12-06 MED ORDER — PSEUDOEPHEDRINE HCL ER 120 MG PO TB12: 120.0000 mg | ORAL_TABLET | Freq: Two times a day (BID) | ORAL | 3 refills | Status: AC

## 2017-12-06 MED ORDER — LEVOCETIRIZINE DIHYDROCHLORIDE 5 MG PO TABS: 5.0000 mg | ORAL_TABLET | Freq: Every evening | ORAL | 0 refills | Status: DC

## 2017-12-06 MED ORDER — MONTELUKAST SODIUM 10 MG PO TABS: 10.0000 mg | ORAL_TABLET | Freq: Every day | ORAL | 0 refills | Status: DC

## 2017-12-06 NOTE — Discharge Instructions (Addendum)
Instead of Xyzal, you can also try Zyrtec, Allegra or Claritin. Make sure you ask the pharmacist for Sudafed (pseudoephedrine). Hydrate well with at least 2 liters (64 ounces) of water daily. You can use meloxicam after you finish prednisone.

## 2017-12-06 NOTE — ED Triage Notes (Signed)
The patient presented to the [facility] with multiple complaints.  The patient reported that she stepped in a hole 3 weeks ago and her left ankle has been hurting.  The patient also complained of coughing and wheezing that she feels has exacerbated her asthma.

## 2017-12-06 NOTE — ED Provider Notes (Signed)
MRN: [id number] DOB: 12/09/1970  Subjective:   Wendy Bradley is a 47 y.o. female presenting for 3 week history of persistent left heel pain after stepping in a hole. Pain is constant, sharp with palpation but otherwise aching, worse with stepping and bearing weight. Patient is not allowed to wear shoes with cushion at work, pain is worse then. Has not tried any medications for pain. Also has a history of asthma, has been using her albuterol inhaler 4-5 times per day. Has had productive cough, nasal congestion. Has also been using Mucinex, otc cough syrups. Denies fever, throat pain, ear pain, chest pain, n/v, abdominal pain, rashes. Denies smoking cigarettes.   No current facility-administered medications for this encounter.   Current Outpatient Medications:  .  albuterol (PROVENTIL HFA;VENTOLIN HFA) 108 (90 Base) MCG/ACT inhaler, Inhale 2 puffs into the lungs every 4 (four) hours as needed for wheezing or shortness of breath., Disp: 1 Inhaler, Rfl: 0   No Known Allergies  Past Medical History:  Diagnosis Date  . Asthma   . GERD (gastroesophageal reflux disease)   . Obesity      Past Surgical History:  Procedure Laterality Date  . TUBAL LIGATION      Objective:   Vitals: BP 139/80 (BP Location: Right Arm)   Pulse 91   Temp 98 F (36.7 C) (Oral)   Resp 20   LMP  (LMP Unknown)   SpO2 100%   Physical Exam  Constitutional: She is oriented to person, place, and time. She appears well-developed and well-nourished.  HENT:  Bilateral ear effusions, mucosal edema with postnasal drainage.  No sinus tenderness, exudates, oral pharyngeal erythema.  Eyes: Pupils are equal, round, and reactive to light. EOM are normal. Right eye exhibits no discharge. Left eye exhibits no discharge. No scleral icterus.  Neck: Normal range of motion. Neck supple.  Cardiovascular: Normal rate, regular rhythm, normal heart sounds and intact distal pulses. Exam reveals no gallop and no friction rub.  No  murmur heard. Pulmonary/Chest: Effort normal. No stridor. No respiratory distress. She has no wheezes. She has no rales.  Musculoskeletal:  Exquisite tenderness of posterior medial.  No Achilles defect or ankle tenderness.  Lymphadenopathy:    She has no cervical adenopathy.  Neurological: She is alert and oriented to person, place, and time.  Skin: Skin is warm and dry.  Psychiatric: She has a normal mood and affect.   Dg Ankle Complete Left  Result Date: 12/06/2017 CLINICAL DATA:  Fall 3 weeks ago EXAM: LEFT ANKLE COMPLETE - 3+ VIEW COMPARISON:  None. FINDINGS: There is no evidence of fracture, dislocation, or joint effusion. There are posterior and plantar calcaneal enthesophytes. Soft tissues are unremarkable. Small accessory ossicle adjacent to the medial malleolus. IMPRESSION: No acute abnormality of the left ankle. Electronically Signed   By: Deatra Robinson M.D.   On: 12/06/2017 16:19   Assessment and Plan :   Allergic rhinitis due to other allergic trigger, unspecified seasonality  Calcaneal spur of foot, left  Patient is to start aggressive management of allergic rhinitis with oral antihistamine, Singulair.  Counseled on management of her heel spur which includes using gel pads, NSAID such as meloxicam.  For now we will use a prednisone course to address her allergic rhinitis of 3 weeks in addition to helping with her current inflammatory heel pain.  Recommended patient contact podiatrist to see if they need to help her manage the heel spur more aggressively.  Work note provided for a couple of days.  Return to clinic precautions reviewed.   Wallis Bamberg, [place] 12/06/17 1610

## 2018-02-26 ENCOUNTER — Other Ambulatory Visit (HOSPITAL_COMMUNITY): Payer: Self-pay | Admitting: Urgent Care

## 2018-12-12 ENCOUNTER — Other Ambulatory Visit: Payer: Self-pay

## 2018-12-12 ENCOUNTER — Encounter (HOSPITAL_COMMUNITY): Payer: Self-pay

## 2018-12-12 ENCOUNTER — Ambulatory Visit (HOSPITAL_COMMUNITY)
Admission: EM | Admit: 2018-12-12 | Discharge: 2018-12-12 | Disposition: A | Discharge status: Home / Self Care | Payer: BC Managed Care – PPO | Attending: Internal Medicine | Admitting: Internal Medicine

## 2018-12-12 DIAGNOSIS — K219 Gastro-esophageal reflux disease without esophagitis: Secondary | ICD-10-CM | POA: Diagnosis not present

## 2018-12-12 DIAGNOSIS — J309 Allergic rhinitis, unspecified: Secondary | ICD-10-CM | POA: Diagnosis not present

## 2018-12-12 DIAGNOSIS — Z79899 Other long term (current) drug therapy: Secondary | ICD-10-CM | POA: Insufficient documentation

## 2018-12-12 DIAGNOSIS — Z87891 Personal history of nicotine dependence: Secondary | ICD-10-CM | POA: Insufficient documentation

## 2018-12-12 DIAGNOSIS — Z20828 Contact with and (suspected) exposure to other viral communicable diseases: Secondary | ICD-10-CM | POA: Diagnosis not present

## 2018-12-12 DIAGNOSIS — R0982 Postnasal drip: Secondary | ICD-10-CM | POA: Diagnosis not present

## 2018-12-12 DIAGNOSIS — R05 Cough: Secondary | ICD-10-CM | POA: Diagnosis present

## 2018-12-12 MED ORDER — ALBUTEROL SULFATE HFA 108 (90 BASE) MCG/ACT IN AERS: 2.0000 | INHALATION_SPRAY | RESPIRATORY_TRACT | 0 refills | Status: AC | PRN

## 2018-12-12 MED ORDER — MONTELUKAST SODIUM 10 MG PO TABS: 10.0000 mg | ORAL_TABLET | Freq: Every day | ORAL | 0 refills | Status: AC

## 2018-12-12 MED ORDER — BENZONATATE 100 MG PO CAPS: 100.0000 mg | ORAL_CAPSULE | Freq: Three times a day (TID) | ORAL | 0 refills | Status: AC

## 2018-12-12 NOTE — ED Provider Notes (Signed)
[place]    CSN: [id number] Arrival date & time: 12/12/18  1736      History   Chief Complaint Chief Complaint  Patient presents with  . Cough    HPI Wendy Bradley is a 48 y.o. female with a history of asthma-controlled, gastroesophageal reflux disease-controlled comes to urgent care with complaints of cough and congestion over the past 3 days.  Patient has seasonal allergies which typically start around this time of the year.  She has a cough which is minimally productive.  She has mild chest pain associated with coughing.  One of her grandchildren had similar symptoms in the recent past.  No shortness of breath.  She admits to having some wheezing.  No fever or chills.  Patient admits to having some postnasal drip.  She has not tried any over-the-counter allergy medications. Marland Kitchen   HPI  Past Medical History:  Diagnosis Date  . Asthma   . GERD (gastroesophageal reflux disease)   . Obesity     There are no active problems to display for this patient.   Past Surgical History:  Procedure Laterality Date  . TUBAL LIGATION      OB History   No obstetric history on file.      Home Medications    Prior to Admission medications   Medication Sig Start Date End Date Taking? Authorizing Provider  albuterol (VENTOLIN HFA) 108 (90 Base) MCG/ACT inhaler Inhale 2 puffs into the lungs every 4 (four) hours as needed for wheezing or shortness of breath. 12/12/18   [place] Scheiderer, Myrene Galas, MD  benzonatate (TESSALON) 100 MG capsule Take 1-2 capsules (100-200 mg total) by mouth 3 (three) times daily as needed. 12/06/17   Jaynee Eagles, PA-C  benzonatate (TESSALON) 100 MG capsule Take 1 capsule (100 mg total) by mouth every 8 (eight) hours. 12/12/18   LampteyMyrene Galas, MD  meloxicam (MOBIC) 7.5 MG tablet Take 2 tablets (15 mg total) by mouth daily. 12/06/17   Jaynee Eagles, PA-C  montelukast (SINGULAIR) 10 MG tablet Take 1 tablet (10 mg total) by mouth at bedtime. 12/12/18    Chase Picket, MD  promethazine-dextromethorphan (PROMETHAZINE-DM) 6.25-15 MG/5ML syrup Take 5 mLs by mouth at bedtime as needed for cough. 12/06/17   Jaynee Eagles, PA-C  pseudoephedrine (SUDAFED 12 HOUR) 120 MG 12 hr tablet Take 1 tablet (120 mg total) by mouth 2 (two) times daily. 12/06/17   Jaynee Eagles, PA-C  levocetirizine (XYZAL) 5 MG tablet Take 1 tablet (5 mg total) by mouth every evening. 12/06/17 12/12/18  Jaynee Eagles, PA-C    Family History Family History  Problem Relation Age of Onset  . Healthy Mother   . Healthy Father     Social History Social History   Tobacco Use  . Smoking status: Former Smoker    Types: Cigarettes    Quit date: 11/26/2012    Years since quitting: 6.0  . Smokeless tobacco: Never Used  Substance Use Topics  . Alcohol use: No  . Drug use: No     Allergies   Patient has no known allergies.   Review of Systems Review of Systems  Constitutional: Negative.   HENT: Positive for congestion. Negative for sinus pressure, sinus pain, sneezing and sore throat.   Eyes: Negative.   Respiratory: Negative for chest tightness and stridor.   Cardiovascular: Negative for palpitations.  Gastrointestinal: Negative.   Musculoskeletal: Negative.  Negative for arthralgias, gait problem and myalgias.  Skin: Negative.  Negative for rash and  wound.  Neurological: Negative.  Negative for dizziness, weakness and light-headedness.     Physical Exam Triage Vital Signs ED Triage Vitals  Enc Vitals Group     BP 12/12/18 1903 120/69     Pulse Rate 12/12/18 1903 80     Resp 12/12/18 1903 19     Temp 12/12/18 1903 98.2 F (36.8 C)     Temp Source 12/12/18 1903 Oral     SpO2 12/12/18 1903 100 %     Weight --      Height --      Head Circumference --      Peak Flow --      Pain Score 12/12/18 1901 0     Pain Loc --      Pain Edu? --      Excl. in GC? --    No data found.  Updated Vital Signs BP 120/69 (BP Location: Right Arm)   Pulse 80   Temp 98.2  F (36.8 C) (Oral)   Resp 19   LMP  (LMP Unknown)   SpO2 100%   Visual Acuity Right Eye Distance:   Left Eye Distance:   Bilateral Distance:    Right Eye Near:   Left Eye Near:    Bilateral Near:     Physical Exam Vitals signs and nursing note reviewed.  Constitutional:      General: She is not in acute distress.    Appearance: She is not ill-appearing or toxic-appearing.  HENT:     Right Ear: Tympanic membrane normal.     Left Ear: Tympanic membrane normal.     Mouth/Throat:     Mouth: Mucous membranes are moist.     Pharynx: No oropharyngeal exudate or posterior oropharyngeal erythema.  Cardiovascular:     Rate and Rhythm: Normal rate and regular rhythm.     Pulses: Normal pulses.     Heart sounds: Normal heart sounds.  Pulmonary:     Effort: Pulmonary effort is normal. No respiratory distress.     Breath sounds: Normal breath sounds. No stridor. No wheezing or rhonchi.  Abdominal:     General: Bowel sounds are normal. There is no distension.     Palpations: Abdomen is soft.     Tenderness: There is no abdominal tenderness. There is no guarding.  Skin:    General: Skin is warm.     Coloration: Skin is not jaundiced.     Findings: No bruising or erythema.  Neurological:     General: No focal deficit present.     Mental Status: She is alert.      UC Treatments / Results  Labs (all labs ordered are listed, but only abnormal results are displayed) Labs Reviewed  NOVEL CORONAVIRUS, NAA (HOSP ORDER, SEND-OUT TO REF LAB; TAT 18-24 HRS)    EKG   Radiology No results found.  Procedures Procedures (including critical care time)  Medications Ordered in UC Medications - No data to display  Initial Impression / Assessment and Plan / UC Course  I have reviewed the triage vital signs and the nursing notes.  Pertinent labs & imaging results that were available during my care of the patient were reviewed by me and considered in my medical decision making (see  chart for details).     1.  Allergic rhinitis with postnasal drip: COVID-19 testing Patient is advised to self isolate until COVID-19 test results available Albuterol refill Singulair 10 mg orally daily Tessalon Perles as needed for cough Patient  is advised to come back to urgent care if symptoms worsen. Final Clinical Impressions(s) / UC Diagnoses   Final diagnoses:  Allergic rhinitis with postnasal drip   Discharge Instructions   None    ED Prescriptions    Medication Sig Dispense Auth. Provider   albuterol (VENTOLIN HFA) 108 (90 Base) MCG/ACT inhaler Inhale 2 puffs into the lungs every 4 (four) hours as needed for wheezing or shortness of breath. 6.7 g Lucylle Foulkes, Britta MccreedyPhilip O, MD   benzonatate (TESSALON) 100 MG capsule Take 1 capsule (100 mg total) by mouth every 8 (eight) hours. 30 capsule Timmia Cogburn, Britta MccreedyPhilip O, MD   montelukast (SINGULAIR) 10 MG tablet Take 1 tablet (10 mg total) by mouth at bedtime. 90 tablet Juston Goheen, Britta MccreedyPhilip O, MD     PDMP not reviewed this encounter.   Merrilee JanskyLamptey, Ninoshka Wainwright O, MD 12/14/18 [phone]

## 2018-12-12 NOTE — ED Triage Notes (Signed)
Patient presents to Urgent Care with complaints of cough and congestion since 3 days ago. Patient reports she often has allergy flare ups that turn into bronchitis and she is here to "nip it in the bud", pt also requesting inhaler refill.

## 2018-12-14 LAB — NOVEL CORONAVIRUS, NAA (HOSP ORDER, SEND-OUT TO REF LAB; TAT 18-24 HRS): SARS-CoV-2, NAA: NOT DETECTED

## 2021-03-14 ENCOUNTER — Other Ambulatory Visit: Payer: Self-pay

## 2021-03-14 ENCOUNTER — Ambulatory Visit
Admission: EM | Admit: 2021-03-14 | Discharge: 2021-03-14 | Disposition: A | Discharge status: Home / Self Care | Payer: 59 | Attending: Physician Assistant | Admitting: Physician Assistant

## 2021-03-14 DIAGNOSIS — J40 Bronchitis, not specified as acute or chronic: Secondary | ICD-10-CM | POA: Diagnosis not present

## 2021-03-14 DIAGNOSIS — J329 Chronic sinusitis, unspecified: Secondary | ICD-10-CM

## 2021-03-14 MED ORDER — PREDNISONE 20 MG PO TABS: 40.0000 mg | ORAL_TABLET | Freq: Every day | ORAL | 0 refills | Status: AC

## 2021-03-14 MED ORDER — AMOXICILLIN-POT CLAVULANATE 875-125 MG PO TABS: 1.0000 | ORAL_TABLET | Freq: Two times a day (BID) | ORAL | 0 refills | Status: AC

## 2021-03-14 NOTE — ED Provider Notes (Signed)
[facility]    CSN: [id number] Arrival date & time: 03/14/21  1635      History   Chief Complaint Chief Complaint  Patient presents with   Cough    HPI Wendy Bradley is a 51 y.o. female.   Patient here today for evaluation of cough, congestion, ear pain and sore throat that started a week ago.  She has not had fever or chills.  She denies any nausea, vomiting or diarrhea.  She reports she has taken at home COVID test that were negative.  The history is provided by the patient.   Past Medical History:  Diagnosis Date   Asthma    GERD (gastroesophageal reflux disease)    Obesity     There are no problems to display for this patient.   Past Surgical History:  Procedure Laterality Date   TUBAL LIGATION      OB History   No obstetric history on file.      Home Medications    Prior to Admission medications   Medication Sig Start Date End Date Taking? Authorizing Provider  amoxicillin-clavulanate (AUGMENTIN) 875-125 MG tablet Take 1 tablet by mouth every 12 (twelve) hours. 03/14/21  Yes Francene Finders, PA-C  predniSONE (DELTASONE) 20 MG tablet Take 2 tablets (40 mg total) by mouth daily with breakfast for 5 days. 03/14/21 03/19/21 Yes Francene Finders, PA-C  albuterol (VENTOLIN HFA) 108 (90 Base) MCG/ACT inhaler Inhale 2 puffs into the lungs every 4 (four) hours as needed for wheezing or shortness of breath. 12/12/18   Lamptey, Myrene Galas, MD  benzonatate (TESSALON) 100 MG capsule Take 1-2 capsules (100-200 mg total) by mouth 3 (three) times daily as needed. 12/06/17   Jaynee Eagles, PA-C  benzonatate (TESSALON) 100 MG capsule Take 1 capsule (100 mg total) by mouth every 8 (eight) hours. 12/12/18   LampteyMyrene Galas, MD  meloxicam (MOBIC) 7.5 MG tablet Take 2 tablets (15 mg total) by mouth daily. 12/06/17   Jaynee Eagles, PA-C  montelukast (SINGULAIR) 10 MG tablet Take 1 tablet (10 mg total) by mouth at bedtime. 12/12/18   Chase Picket, MD   promethazine-dextromethorphan (PROMETHAZINE-DM) 6.25-15 MG/5ML syrup Take 5 mLs by mouth at bedtime as needed for cough. 12/06/17   Jaynee Eagles, PA-C  pseudoephedrine (SUDAFED 12 HOUR) 120 MG 12 hr tablet Take 1 tablet (120 mg total) by mouth 2 (two) times daily. 12/06/17   Jaynee Eagles, PA-C  levocetirizine (XYZAL) 5 MG tablet Take 1 tablet (5 mg total) by mouth every evening. 12/06/17 12/12/18  Jaynee Eagles, PA-C    Family History Family History  Problem Relation Age of Onset   Healthy Mother    Healthy Father     Social History Social History   Tobacco Use   Smoking status: Former    Types: Cigarettes    Quit date: 11/26/2012    Years since quitting: 8.3   Smokeless tobacco: Never  Vaping Use   Vaping Use: Never used  Substance Use Topics   Alcohol use: No   Drug use: No     Allergies   Patient has no known allergies.   Review of Systems Review of Systems  Constitutional:  Negative for chills and fever.  HENT:  Positive for congestion, sinus pressure and sore throat. Negative for ear pain.   Eyes:  Negative for discharge and redness.  Respiratory:  Positive for cough. Negative for shortness of breath and wheezing.   Gastrointestinal:  Negative for abdominal pain,  diarrhea, nausea and vomiting.    Physical Exam Triage Vital Signs ED Triage Vitals  Enc Vitals Group     BP      Pulse      Resp      Temp      Temp src      SpO2      Weight      Height      Head Circumference      Peak Flow      Pain Score      Pain Loc      Pain Edu?      Excl. in [place]?    No data found.  Updated Vital Signs BP 137/82 (BP Location: Left Arm)    Pulse (!) 101    Temp 98.4 F (36.9 C) (Oral)    Resp 18    LMP  (LMP Unknown)    SpO2 96%      Physical Exam Vitals and nursing note reviewed.  Constitutional:      General: She is not in acute distress.    Appearance: Normal appearance. She is not ill-appearing.  HENT:     Head: Normocephalic and atraumatic.     Ears:      Comments: Bilateral middle ear effusions noted    Nose: Congestion present.     Mouth/Throat:     Mouth: Mucous membranes are moist.     Pharynx: No oropharyngeal exudate or posterior oropharyngeal erythema.  Eyes:     Conjunctiva/sclera: Conjunctivae normal.  Cardiovascular:     Rate and Rhythm: Normal rate and regular rhythm.     Heart sounds: Normal heart sounds. No murmur heard. Pulmonary:     Effort: Pulmonary effort is normal. No respiratory distress.     Breath sounds: Normal breath sounds. No wheezing, rhonchi or rales.     Comments: Deep breathing produces cough Skin:    General: Skin is warm and dry.  Neurological:     Mental Status: She is alert.  Psychiatric:        Mood and Affect: Mood normal.        Thought Content: Thought content normal.     UC Treatments / Results  Labs (all labs ordered are listed, but only abnormal results are displayed) Labs Reviewed - No data to display  EKG   Radiology No results found.  Procedures Procedures (including critical care time)  Medications Ordered in UC Medications - No data to display  Initial Impression / Assessment and Plan / UC Course  I have reviewed the triage vital signs and the nursing notes.  Pertinent labs & imaging results that were available during my care of the patient were reviewed by me and considered in my medical decision making (see chart for details).    Will treat to cover bronchitis as well as sinusitis given duration of symptoms. Prednisone burst and augmentin prescribed. Recommend follow up if symptoms fail to improve or worsen.   Final Clinical Impressions(s) / UC Diagnoses   Final diagnoses:  Sinobronchitis   Discharge Instructions   None    ED Prescriptions     Medication Sig Dispense Auth. Provider   predniSONE (DELTASONE) 20 MG tablet Take 2 tablets (40 mg total) by mouth daily with breakfast for 5 days. 10 tablet Francene Finders, PA-C   amoxicillin-clavulanate  (AUGMENTIN) 875-125 MG tablet Take 1 tablet by mouth every 12 (twelve) hours. 14 tablet Francene Finders, PA-C      PDMP not reviewed  this encounter.   Francene Finders, PA-C 03/14/21 1739

## 2021-03-14 NOTE — ED Triage Notes (Signed)
Pt c/o cough, sore throat, intermittent nasal congestion, ear aches, right eye irritation   Denies headache, body aches or chills, nausea, vomiting, diarrhea, constipation   Onset ~ Saturday
